# Patient Record
Sex: Male | Born: 1965 | Race: White | Hispanic: No | Marital: Married | Smoking: Never smoker
Health system: Southern US, Community
[De-identification: ages and names within clinical notes are randomized; demographics above are authoritative.]

## PROBLEM LIST (undated history)

## (undated) DIAGNOSIS — E0590 Thyrotoxicosis, unspecified without thyrotoxic crisis or storm: Secondary | ICD-10-CM

## (undated) DIAGNOSIS — E785 Hyperlipidemia, unspecified: Secondary | ICD-10-CM

## (undated) DIAGNOSIS — N400 Enlarged prostate without lower urinary tract symptoms: Secondary | ICD-10-CM

## (undated) DIAGNOSIS — G4733 Obstructive sleep apnea (adult) (pediatric): Secondary | ICD-10-CM

## (undated) DIAGNOSIS — G4730 Sleep apnea, unspecified: Secondary | ICD-10-CM

## (undated) DIAGNOSIS — T7840XA Allergy, unspecified, initial encounter: Secondary | ICD-10-CM

## (undated) DIAGNOSIS — I10 Essential (primary) hypertension: Secondary | ICD-10-CM

## (undated) DIAGNOSIS — J3089 Other allergic rhinitis: Secondary | ICD-10-CM

## (undated) DIAGNOSIS — Z86010 Personal history of colonic polyps: Secondary | ICD-10-CM

## (undated) HISTORY — DX: Sleep apnea, unspecified: G47.30

## (undated) HISTORY — PX: BLADDER SURGERY: SHX569

## (undated) HISTORY — DX: Other allergic rhinitis: J30.89

## (undated) HISTORY — DX: Obstructive sleep apnea (adult) (pediatric): G47.33

## (undated) HISTORY — DX: Allergy, unspecified, initial encounter: T78.40XA

## (undated) HISTORY — DX: Thyrotoxicosis, unspecified without thyrotoxic crisis or storm: E05.90

## (undated) HISTORY — DX: Essential (primary) hypertension: I10

## (undated) HISTORY — DX: Personal history of colonic polyps: Z86.010

## (undated) HISTORY — DX: Benign prostatic hyperplasia without lower urinary tract symptoms: N40.0

## (undated) HISTORY — PX: TONSILLECTOMY: SUR1361

## (undated) HISTORY — DX: Hyperlipidemia, unspecified: E78.5

---

## 1998-11-23 ENCOUNTER — Encounter: Payer: Self-pay | Admitting: Family Medicine

## 2000-03-28 ENCOUNTER — Encounter (INDEPENDENT_AMBULATORY_CARE_PROVIDER_SITE_OTHER): Payer: Self-pay | Admitting: Specialist

## 2000-03-28 ENCOUNTER — Ambulatory Visit (HOSPITAL_COMMUNITY): Admission: RE | Admit: 2000-03-28 | Discharge: 2000-03-28 | Payer: Self-pay | Admitting: Urology

## 2000-03-28 HISTORY — PX: CYSTOSCOPY: SUR368

## 2004-12-23 ENCOUNTER — Ambulatory Visit: Payer: Self-pay | Admitting: Family Medicine

## 2005-01-02 ENCOUNTER — Ambulatory Visit: Payer: Self-pay | Admitting: Family Medicine

## 2006-01-08 ENCOUNTER — Ambulatory Visit: Payer: Self-pay | Admitting: Family Medicine

## 2006-01-10 ENCOUNTER — Ambulatory Visit: Payer: Self-pay | Admitting: Family Medicine

## 2006-02-16 ENCOUNTER — Ambulatory Visit: Payer: Self-pay | Admitting: Family Medicine

## 2006-02-22 ENCOUNTER — Ambulatory Visit: Payer: Self-pay | Admitting: Family Medicine

## 2006-09-04 ENCOUNTER — Ambulatory Visit: Payer: Self-pay | Admitting: Family Medicine

## 2006-09-07 ENCOUNTER — Ambulatory Visit: Payer: Self-pay | Admitting: Family Medicine

## 2006-09-18 ENCOUNTER — Ambulatory Visit: Payer: Self-pay | Admitting: Oncology

## 2006-09-24 LAB — CBC WITH DIFFERENTIAL (CANCER CENTER ONLY)
BASO%: 2.6 % — ABNORMAL HIGH (ref 0.0–2.0)
EOS%: 3.3 % (ref 0.0–7.0)
HCT: 49.5 % (ref 38.7–49.9)
LYMPH%: 36.3 % (ref 14.0–48.0)
MCH: 31 pg (ref 28.0–33.4)
MCHC: 33.6 g/dL (ref 32.0–35.9)
MCV: 92 fL (ref 82–98)
NEUT%: 52 % (ref 40.0–80.0)
RDW: 12 % (ref 10.5–14.6)

## 2006-09-26 ENCOUNTER — Ambulatory Visit: Payer: Self-pay | Admitting: Internal Medicine

## 2006-09-26 LAB — IRON AND TIBC
TIBC: 366 ug/dL (ref 250–470)
UIBC: 266 ug/dL

## 2006-09-26 LAB — PROTEIN ELECTROPHORESIS, SERUM
Alpha-1-Globulin: 3.6 % (ref 2.9–4.9)
Alpha-2-Globulin: 8.4 % (ref 7.1–11.8)
Beta 2: 4.2 % (ref 3.2–6.5)
Gamma Globulin: 13.2 % (ref 11.1–18.8)

## 2006-09-26 LAB — COMPREHENSIVE METABOLIC PANEL
Albumin: 4.3 g/dL (ref 3.5–5.2)
CO2: 28 mEq/L (ref 19–32)
Glucose, Bld: 84 mg/dL (ref 70–99)
Total Protein: 6.3 g/dL (ref 6.0–8.3)

## 2006-09-26 LAB — FOLATE: Folate: 19.6 ng/mL

## 2006-09-26 LAB — RETICULOCYTES (CHCC)
ABS Retic: 31.9 10*3/uL (ref 19.0–186.0)
RBC.: 5.32 MIL/uL — ABNORMAL HIGH (ref 3.79–4.96)
Retic Ct Pct: 0.6 % (ref 0.4–3.1)

## 2006-09-26 LAB — LACTATE DEHYDROGENASE: LDH: 127 U/L (ref 94–250)

## 2006-09-28 LAB — HYPERCOAGULABLE PANEL, COMPREHENSIVE
Anticardiolipin IgA: <7 [APL'U] (ref <13)
Anticardiolipin IgG: <7 [GPL'U] (ref <11)
Anticardiolipin IgM: <7 [MPL'U] (ref <10)
Beta-2 Glyco I IgG: 22 U/mL (ref <20)
DRVVT: 42.8 secs (ref 31.9–44.2)
PTT Lupus Anticoagulant: 53.3 secs — ABNORMAL HIGH (ref 36.3–48.8)
PTTLA 4:1 Mix: 48.2 secs (ref 36.3–48.8)
Protein C, Total: 97 % (ref 63–153)

## 2006-09-28 LAB — BCR/ABL QUALITATIVE: BCR/ABL t(9,22): NEGATIVE

## 2006-10-08 ENCOUNTER — Encounter (INDEPENDENT_AMBULATORY_CARE_PROVIDER_SITE_OTHER): Payer: Self-pay | Admitting: Specialist

## 2006-10-08 ENCOUNTER — Ambulatory Visit: Payer: Self-pay | Admitting: Internal Medicine

## 2006-10-08 ENCOUNTER — Encounter: Payer: Self-pay | Admitting: Internal Medicine

## 2006-10-24 LAB — CBC WITH DIFFERENTIAL (CANCER CENTER ONLY)
BASO#: 0.1 10*3/uL (ref 0.0–0.2)
BASO%: 0.9 % (ref 0.0–2.0)
EOS%: 3.9 % (ref 0.0–7.0)
HGB: 17.2 g/dL — ABNORMAL HIGH (ref 13.0–17.1)
LYMPH#: 1.5 10*3/uL (ref 0.9–3.3)
MCHC: 34.2 g/dL (ref 32.0–35.9)
MONO%: 5.8 % (ref 0.0–13.0)
NEUT#: 4.3 10*3/uL (ref 1.5–6.5)
Platelets: 174 10*3/uL (ref 145–400)

## 2006-10-29 ENCOUNTER — Ambulatory Visit (HOSPITAL_COMMUNITY): Admission: RE | Admit: 2006-10-29 | Discharge: 2006-10-29 | Payer: Self-pay | Admitting: Oncology

## 2006-10-29 ENCOUNTER — Encounter (INDEPENDENT_AMBULATORY_CARE_PROVIDER_SITE_OTHER): Payer: Self-pay | Admitting: *Deleted

## 2006-11-20 ENCOUNTER — Ambulatory Visit: Payer: Self-pay | Admitting: Oncology

## 2006-11-21 LAB — CBC WITH DIFFERENTIAL (CANCER CENTER ONLY)
BASO%: 1.8 % (ref 0.0–2.0)
EOS%: 4.7 % (ref 0.0–7.0)
HCT: 49.6 % (ref 38.7–49.9)
LYMPH#: 1.3 10*3/uL (ref 0.9–3.3)
MCH: 32.2 pg (ref 28.0–33.4)
MCHC: 34.5 g/dL (ref 32.0–35.9)
MONO%: 7.1 % (ref 0.0–13.0)
NEUT#: 1.7 10*3/uL (ref 1.5–6.5)
NEUT%: 49.4 % (ref 40.0–80.0)
RDW: 12.1 % (ref 10.5–14.6)

## 2007-01-14 ENCOUNTER — Ambulatory Visit: Payer: Self-pay | Admitting: Oncology

## 2007-01-15 LAB — CBC WITH DIFFERENTIAL (CANCER CENTER ONLY)
BASO#: 0 10*3/uL (ref 0.0–0.2)
Eosinophils Absolute: 0.3 10*3/uL (ref 0.0–0.5)
HGB: 16.1 g/dL (ref 13.0–17.1)
LYMPH#: 1.4 10*3/uL (ref 0.9–3.3)
MCH: 31.9 pg (ref 28.0–33.4)
MONO#: 0.2 10*3/uL (ref 0.1–0.9)
NEUT#: 1.9 10*3/uL (ref 1.5–6.5)
Platelets: 179 10*3/uL (ref 145–400)
RBC: 5.04 10*6/uL (ref 4.20–5.70)
WBC: 3.8 10*3/uL — ABNORMAL LOW (ref 4.0–10.0)

## 2007-01-17 LAB — CARBOXYHEMOGLOBIN: Carboxyhemoglobin: 0.9 % (ref 0.0–1.5)

## 2007-04-12 ENCOUNTER — Ambulatory Visit: Payer: Self-pay | Admitting: Oncology

## 2007-04-23 LAB — CBC WITH DIFFERENTIAL (CANCER CENTER ONLY)
BASO#: 0.1 10*3/uL (ref 0.0–0.2)
Eosinophils Absolute: 0.2 10*3/uL (ref 0.0–0.5)
HCT: 49.4 % (ref 38.7–49.9)
HGB: 16.8 g/dL (ref 13.0–17.1)
LYMPH%: 39.3 % (ref 14.0–48.0)
MCH: 31.4 pg (ref 28.0–33.4)
MCV: 93 fL (ref 82–98)
MONO#: 0.3 10*3/uL (ref 0.1–0.9)
MONO%: 7.7 % (ref 0.0–13.0)
Platelets: 177 10*3/uL (ref 145–400)
RBC: 5.34 10*6/uL (ref 4.20–5.70)
WBC: 4.5 10*3/uL (ref 4.0–10.0)

## 2007-10-24 HISTORY — PX: COLONOSCOPY: SHX174

## 2007-11-14 IMAGING — CT CT BIOPSY
1 of 2 series · 15 of 32 positions shown, 19 images · non-contrast
Comparison: none

CLINICAL DATA: 40-year-old male with elevated red blood cell count concerning for polycythemia rubra Campante versus myelodysplastic syndrome. 
CT GUIDED LEFT ILIAC BONE MARROW ASPIRATE AND CORE BIOPSIES:
Radiologist:  Jansen Charley, M.D.
Guidance:  Non-contrast CT.
Complications:  No immediate complications.  
Medications:  2 mg Versed and 100 micrograms Fentanyl.
Procedure/Findings:  Informed consent was obtained from the patient.  
The patient was positioned prone.  Localization noncontrast CT was performed to identify the left iliac crest.  From a posterior approach, an 11 gauge bone biopsy needle was advanced under sterile conditions and local anesthesia.  Needle position was confirmed within the left iliac bone marrow.  Initially, two bone marrow aspirates were performed followed by an 11 gauge core biopsy.  Samples were adequate and reviewed with the cytotechnologist.  The biopsy needle was removed.  There were no immediate complications.  Hemostasis was obtained with compression.  The patient tolerated the procedure well.

[Series 2: pel 5.0 b70f st · axial · 0.80mm/px · z∈[-236,-80]mm · 15 of 36 slices shown, 19 images]
[im 3/36  soft-tissue]
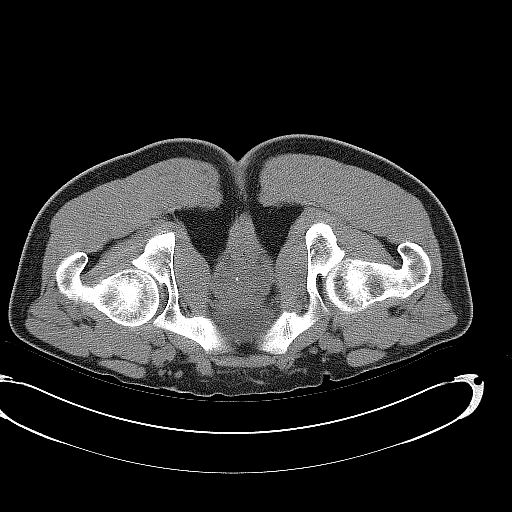
[im 3/36  bone]
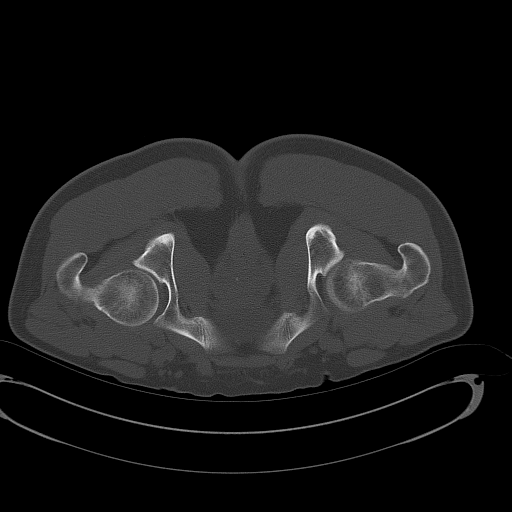
[im 5/36  soft-tissue]
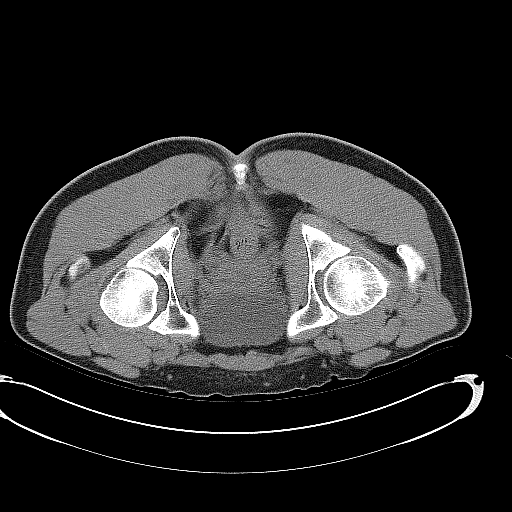
[im 8/36  soft-tissue]
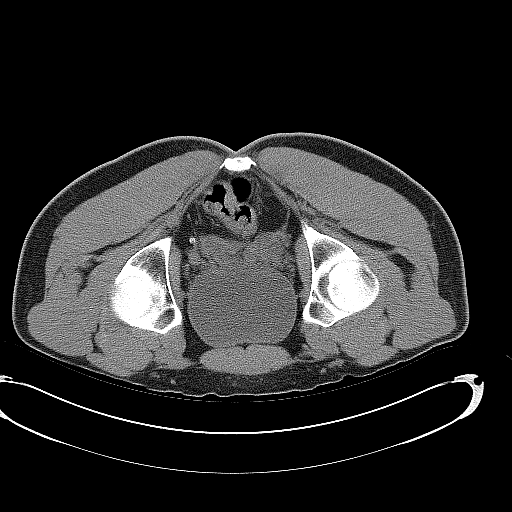
[im 10/36  soft-tissue]
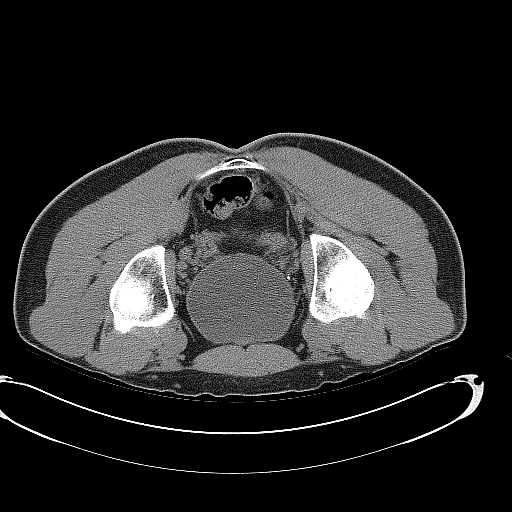
[im 13/36  soft-tissue]
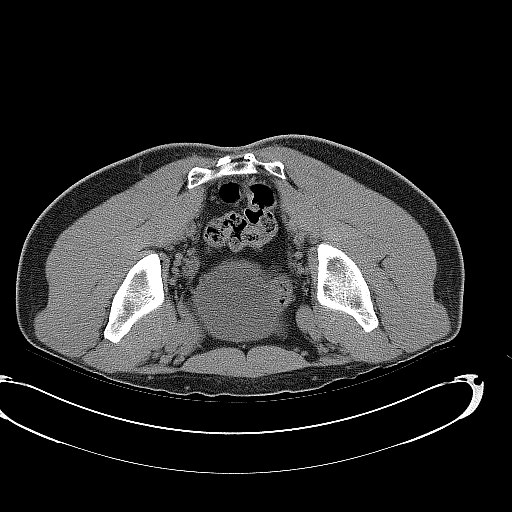
[im 15/36  soft-tissue]
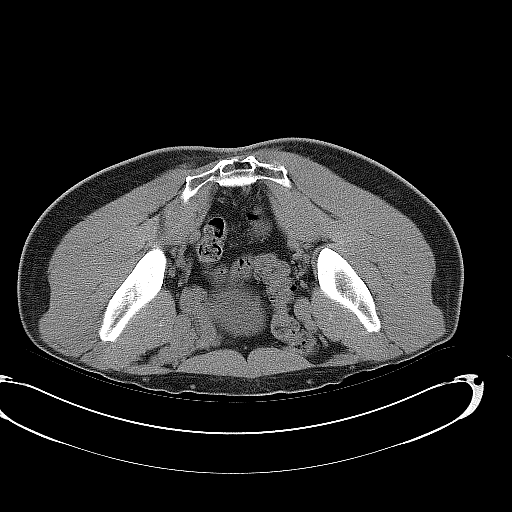
[im 19/36  soft-tissue]
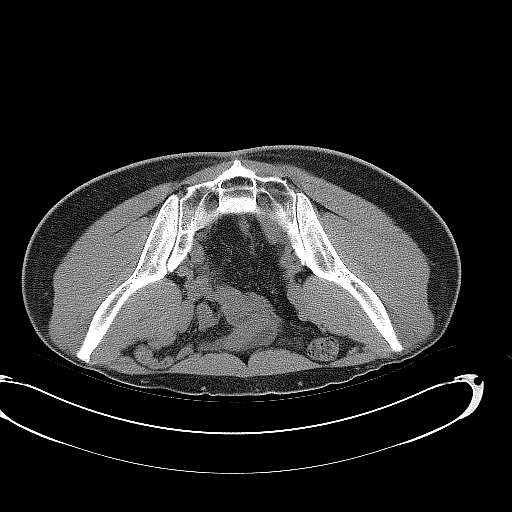
[im 21/36  soft-tissue]
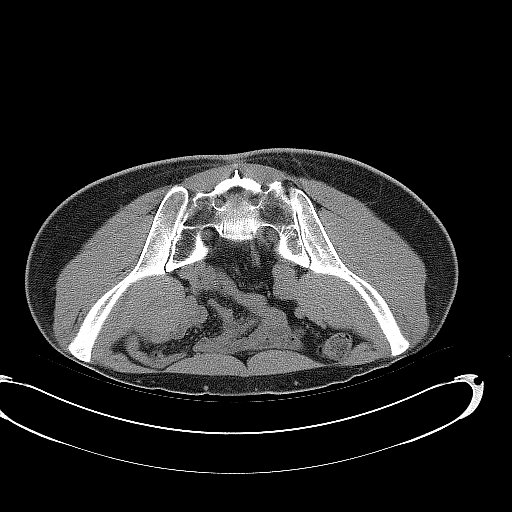
[im 23/36  soft-tissue]
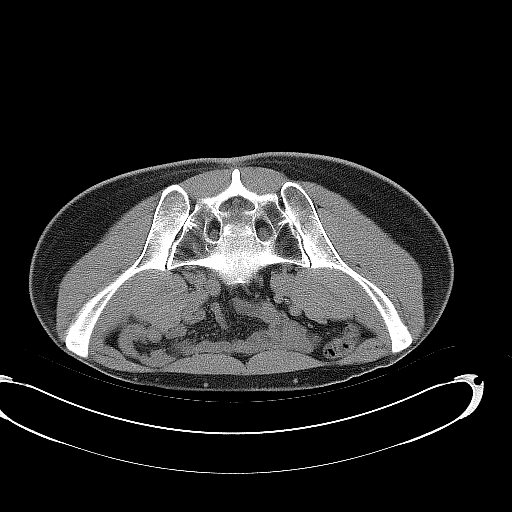
[im 23/36  bone]
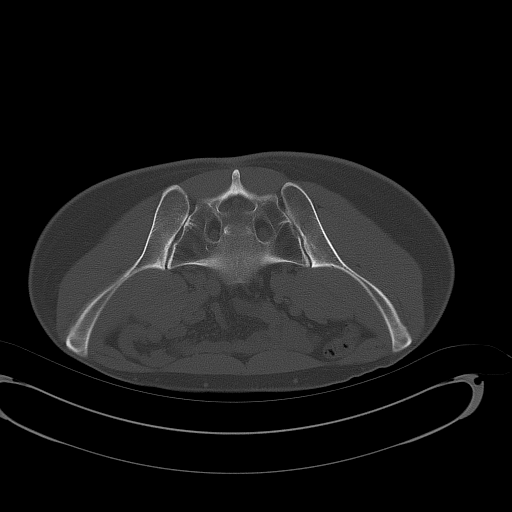
[im 26/36  soft-tissue]
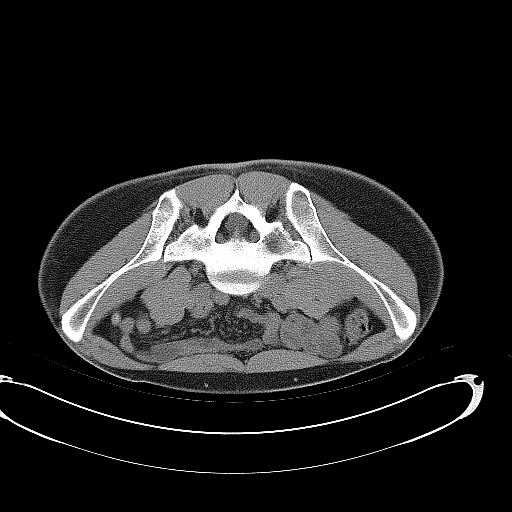
[im 28/36  soft-tissue]
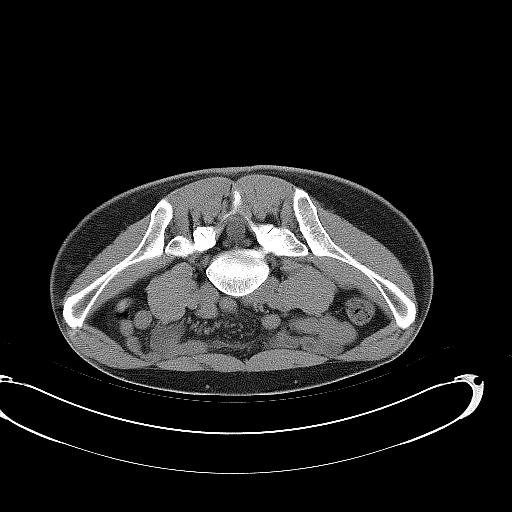
[im 31/36  soft-tissue]
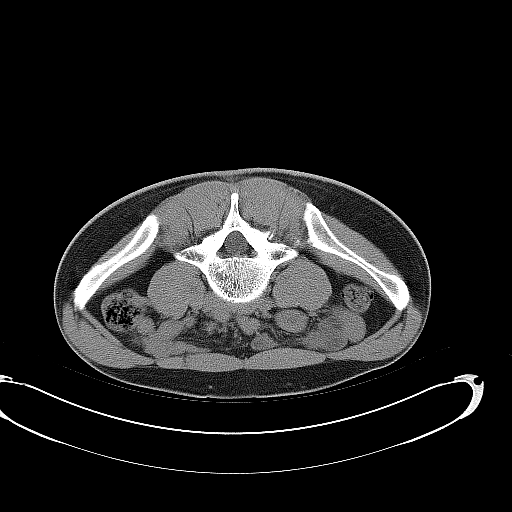
[im 31/36  lung]
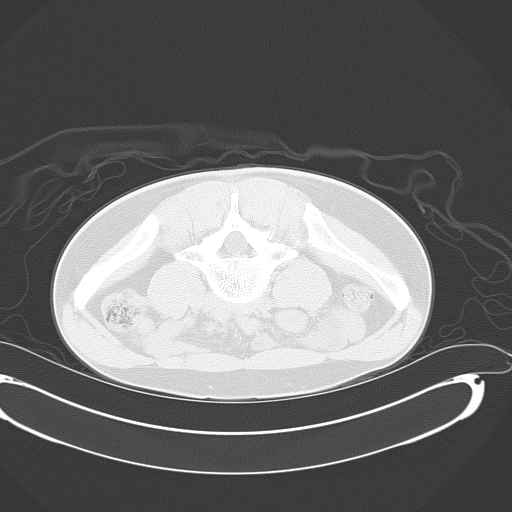
[im 32/36  lung]
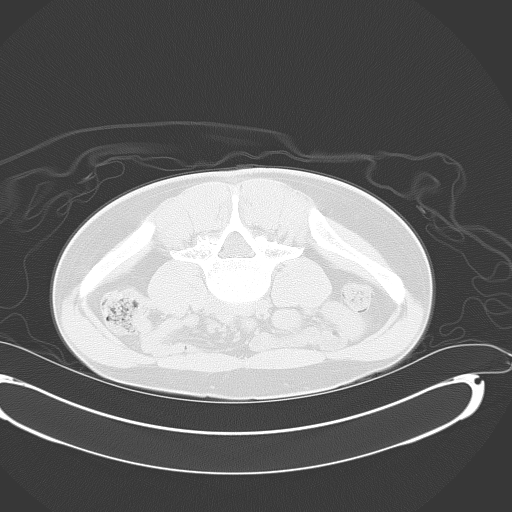
[im 33/36  soft-tissue]
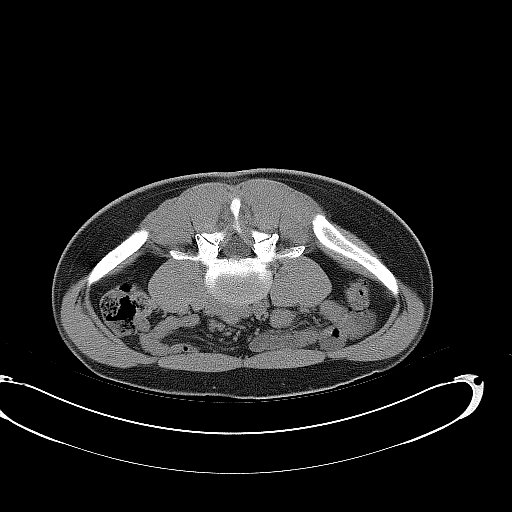
[im 33/36  lung]
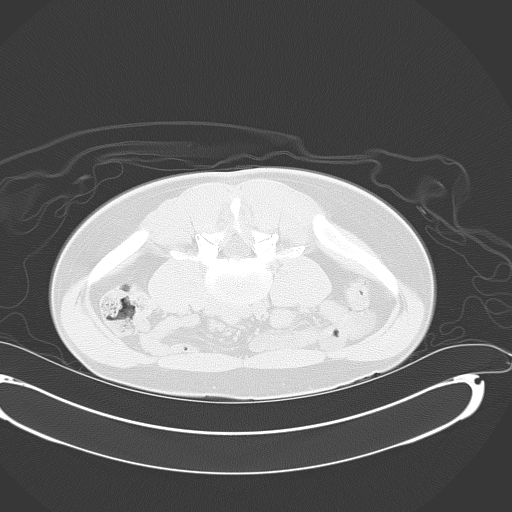
[im 34/36  lung]
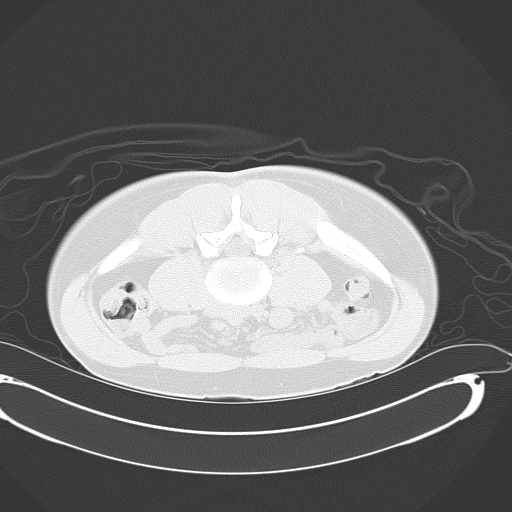

[15 of 32 positions shown; findings below may reference images not displayed]

IMPRESSION: 1.  CT guided left iliac bone marrow aspirate and core biopsies utilizing an 11 gauge bone biopsy set.
PLAN:  The patient will be in recovery for 1 hour.

## 2008-01-09 ENCOUNTER — Ambulatory Visit: Payer: Self-pay | Admitting: Family Medicine

## 2008-01-09 DIAGNOSIS — E785 Hyperlipidemia, unspecified: Secondary | ICD-10-CM | POA: Insufficient documentation

## 2008-01-09 DIAGNOSIS — N401 Enlarged prostate with lower urinary tract symptoms: Secondary | ICD-10-CM

## 2008-01-09 DIAGNOSIS — N138 Other obstructive and reflux uropathy: Secondary | ICD-10-CM | POA: Insufficient documentation

## 2008-01-10 ENCOUNTER — Ambulatory Visit: Payer: Self-pay | Admitting: Family Medicine

## 2008-01-10 LAB — CONVERTED CEMR LAB
AST: 19 units/L (ref 0–37)
Alkaline Phosphatase: 49 units/L (ref 39–117)
BUN: 11 mg/dL (ref 6–23)
Bilirubin, Direct: 0.1 mg/dL (ref 0.0–0.3)
CO2: 30 meq/L (ref 19–32)
Calcium: 9.4 mg/dL (ref 8.4–10.5)
Chloride: 104 meq/L (ref 96–112)
GFR calc non Af Amer: 79 mL/min
HDL: 46.3 mg/dL (ref >39.0)
TSH: 0.43 microintl units/mL (ref 0.35–5.50)
Total CHOL/HDL Ratio: 3.3

## 2008-09-08 ENCOUNTER — Telehealth: Payer: Self-pay | Admitting: Family Medicine

## 2009-03-26 ENCOUNTER — Ambulatory Visit: Payer: Self-pay | Admitting: Internal Medicine

## 2009-04-01 ENCOUNTER — Telehealth (INDEPENDENT_AMBULATORY_CARE_PROVIDER_SITE_OTHER): Payer: Self-pay | Admitting: Internal Medicine

## 2010-01-31 ENCOUNTER — Ambulatory Visit: Payer: Self-pay | Admitting: Family Medicine

## 2010-01-31 DIAGNOSIS — N410 Acute prostatitis: Secondary | ICD-10-CM | POA: Insufficient documentation

## 2010-02-02 ENCOUNTER — Encounter: Payer: Self-pay | Admitting: Family Medicine

## 2010-02-02 DIAGNOSIS — J309 Allergic rhinitis, unspecified: Secondary | ICD-10-CM | POA: Insufficient documentation

## 2010-02-02 DIAGNOSIS — D751 Secondary polycythemia: Secondary | ICD-10-CM | POA: Insufficient documentation

## 2010-02-02 DIAGNOSIS — Z87898 Personal history of other specified conditions: Secondary | ICD-10-CM | POA: Insufficient documentation

## 2010-03-03 ENCOUNTER — Ambulatory Visit: Payer: Self-pay | Admitting: Family Medicine

## 2010-03-03 LAB — CONVERTED CEMR LAB
ALT: 14 units/L (ref 0–53)
AST: 19 units/L (ref 0–37)
Basophils Absolute: 0 10*3/uL (ref 0.0–0.1)
Bilirubin, Direct: 0.1 mg/dL (ref 0.0–0.3)
Calcium: 9 mg/dL (ref 8.4–10.5)
Cholesterol: 168 mg/dL (ref 0–200)
Creatinine, Ser: 1 mg/dL (ref 0.4–1.5)
Eosinophils Absolute: 0.1 10*3/uL (ref 0.0–0.7)
Glucose, Bld: 95 mg/dL (ref 70–99)
HCT: 44.8 % (ref 39.0–52.0)
Hemoglobin: 15.9 g/dL (ref 13.0–17.0)
MCHC: 35.4 g/dL (ref 30.0–36.0)
MCV: 92.8 fL (ref 78.0–100.0)
Monocytes Relative: 10 % (ref 3.0–12.0)
Neutrophils Relative %: 48.2 % (ref 43.0–77.0)
Platelets: 174 10*3/uL (ref 150.0–400.0)
RDW: 12.9 % (ref 11.5–14.6)
Sodium: 142 meq/L (ref 135–145)
TSH: 0.49 microintl units/mL (ref 0.35–5.50)
Total Bilirubin: 0.7 mg/dL (ref 0.3–1.2)
WBC: 3.2 10*3/uL — ABNORMAL LOW (ref 4.5–10.5)

## 2010-03-09 ENCOUNTER — Ambulatory Visit: Payer: Self-pay | Admitting: Family Medicine

## 2010-03-09 DIAGNOSIS — L408 Other psoriasis: Secondary | ICD-10-CM | POA: Insufficient documentation

## 2010-03-25 ENCOUNTER — Ambulatory Visit: Payer: Self-pay | Admitting: Family Medicine

## 2010-03-25 DIAGNOSIS — J018 Other acute sinusitis: Secondary | ICD-10-CM | POA: Insufficient documentation

## 2010-04-07 ENCOUNTER — Ambulatory Visit: Payer: Self-pay | Admitting: Family Medicine

## 2010-04-07 DIAGNOSIS — J029 Acute pharyngitis, unspecified: Secondary | ICD-10-CM | POA: Insufficient documentation

## 2010-05-26 ENCOUNTER — Encounter (INDEPENDENT_AMBULATORY_CARE_PROVIDER_SITE_OTHER): Payer: Self-pay | Admitting: *Deleted

## 2010-06-02 ENCOUNTER — Encounter: Payer: Self-pay | Admitting: Family Medicine

## 2010-06-16 ENCOUNTER — Telehealth: Payer: Self-pay | Admitting: Family Medicine

## 2010-11-22 NOTE — Progress Notes (Signed)
Summary: avodart and flomax  Phone Note Refill Request Call back at Home Phone [phone] Message from:  Patient on June 16, 2010 8:32 AM  Refills Requested: Medication #1:  AVODART 0.5 MG  CAPS 1 daily by mouth at bedtime  Medication #2:  FLOMAX 0.4 MG CAPS take one tablet by mouth as needed Patient would like these faxed to [facility].   Initial call taken by: Melody Comas,  June 16, 2010 8:32 AM  Follow-up for Phone Call        done.  Follow-up by: Crawford Givens MD,  June 16, 2010 9:44 AM    Prescriptions: AVODART 0.5 MG  CAPS (DUTASTERIDE) 1 daily by mouth at bedtime  #90 x 3   Entered and Authorized by:   Crawford Givens MD   Signed by:   Crawford Givens MD on 06/16/2010   Method used:   Faxed to ...       [facility] (mail-order)             , [place]         Ph: [phone]       Fax: [phone]   RxID:   [id number] FLOMAX 0.4 MG CAPS (TAMSULOSIN HCL) take one tablet by mouth as needed  #90 x 3   Entered and Authorized by:   Crawford Givens MD   Signed by:   Crawford Givens MD on 06/16/2010   Method used:   Faxed to ...       [facility] (mail-order)             , [place]         Ph: [phone]       Fax: [phone]   RxID:   [id number]

## 2010-11-22 NOTE — Consult Note (Signed)
Summary: [facility]  [facility]   Imported By: Maryln Gottron 06/07/2010 15:03:53  _____________________________________________________________________  External Attachment:    Type:   Image     Comment:   External Document  Appended Document:  [facility]     Clinical Lists Changes  Observations: Added new observation of PAST MED HX: Allergic rhinitis- eval by Dr. Jenne Pane with ENT 2011  (06/08/2010 13:18)       Past History:  Past Medical History: Allergic rhinitis- eval by Dr. Jenne Pane with ENT 2011

## 2010-11-22 NOTE — Letter (Signed)
Summary: [name] letter  William Tucker at [facility]  [address] [place], [place] [address]   Phone: [phone]  Fax: [phone]       05/26/2010 MRN: [id number]  [name] [address] [place], [place]  [address]  Dear Mr. [facility] [place] and [place] announce the retirement of William Tucker, M.D., from full-time practice at the [facility] office effective April 21, 2010 and his plans of returning part-time.  It is important to [name] and to our practice that you understand that [facility] has seven physicians in our office for your health care needs.  We will continue to offer the same exceptional care that you have today.    [name] has spoken to many of you about his plans for retirement and returning part-time in the fall.   We will continue to work with you through the transition to schedule appointments for you in the office and meet the high standards that [facility] is committed to.   Again, it is with great pleasure that we share the news that [name] will return to [facility] at [facility] in October of 2011 with a reduced schedule.    If you have any questions, or would like to request an appointment with one of our physicians, please call us at [phone] and press the option for Scheduling an appointment.  We take pleasure in providing you with excellent patient care and look forward to seeing you at your next office visit.  Our [facility] Physicians are:  Tillman Abide, M.D. Laurita Quint, M.D. Roxy Manns, M.D. Kerby Nora, M.D. Hannah Beat, M.D. Ruthe Mannan, M.D. We proudly welcomed Raechel Ache, M.D. and Eustaquio Boyden, M.D. to the practice in July/August 2011.  Sincerely,  [facility]

## 2010-11-22 NOTE — Assessment & Plan Note (Signed)
Summary: SINUS INFECTION/JRR   Vital Signs:  Patient profile:   45 year old male Height:      67.25 inches Weight:      187.25 pounds BMI:     29.22 Temp:     98.3 degrees F oral Pulse rate:   68 / minute Pulse rhythm:   regular BP sitting:   110 / 82  (right arm) Cuff size:   regular  Vitals Entered By: Linde Gillis CMA Duncan Dull) (March 25, 2010 3:09 PM) CC: ? sinus infection Comments Patient is a Emergency planning/management officer for [employer] and he weighed with his belt on including gun and flashlight, approx 20 pounds.   History of Present Illness: 45 yo here for ? sinus infecition.  Started with runny nose, sore throat last week. Now has progressed with chills, body aches, productive cough, sinus pressure. NO wheezing, SOB, or CP. No n/v/d.  Current Medications (verified): 1)  Flomax 0.4 Mg Caps (Tamsulosin Hcl) .... Take One Tablet By Mouth As Needed 2)  Avodart 0.5 Mg  Caps (Dutasteride) .Marland Kitchen.. 1 Daily By Mouth At Bedtime 3)  Multivitamins   Tabs (Multiple Vitamin) .Marland Kitchen.. 1 Daily As Needed By Mouth 4)  Afrin Nasal Spray 0.05 % Soln (Oxymetazoline Hcl) .... Otc As Directed 5)  Clarinex 5 Mg Tabs (Desloratadine) .Marland Kitchen.. 1 Once Daily For Nasal Congestion 6)  Nasonex 50 Mcg/act Susp (Mometasone Furoate) .... 2 Sprays Each Nostril Once Daily 7)  Aspirin 81 Mg Tbec (Aspirin) .... Take One By Mouth Daily 8)  Augmentin 875-125 Mg Tabs (Amoxicillin-Pot Clavulanate) .Marland Kitchen.. 1 By Mouth 2 Times Daily X 10 Days  Allergies (verified): No Known Drug Allergies  Review of Systems      See HPI General:  Complains of malaise; denies fever. ENT:  Complains of nasal congestion, postnasal drainage, sinus pressure, and sore throat. Resp:  Complains of cough and sputum productive; denies shortness of breath and wheezing.  Physical Exam  General:  Well-developed,well-nourished,in no acute distress; alert,appropriate and cooperative throughout examination VSS Ears:  TMs retracted bilaterally Nose:  boggy  turbinates Mouth:  pharyngeal erythema.   Lungs:  Normal respiratory effort, chest expands symmetrically. Lungs are clear to auscultation, no crackles or wheezes. Heart:  Normal rate and regular rhythm. S1 and S2 normal without gallop, murmur, click, rub or other extra sounds. Extremities:  No clubbing, cyanosis, edema, or deformity noted with normal full range of motion of all joints.   Psych:  Cognition and judgment appear intact. Alert and cooperative with normal attention span and concentration. No apparent delusions, illusions, hallucinations   Impression & Recommendations:  Problem # 1:  OTHER ACUTE SINUSITIS (ICD-461.8) Assessment New Given duration and progression of symptoms, will treat for bacterial sinusitis with Augmentin.  See pt instructions for supportive care. His updated medication list for this problem includes:    Afrin Nasal Spray 0.05 % Soln (Oxymetazoline hcl) ..... Otc as directed    Nasonex 50 Mcg/act Susp (Mometasone furoate) .Marland Kitchen... 2 sprays each nostril once daily    Augmentin 875-125 Mg Tabs (Amoxicillin-pot clavulanate) .Marland Kitchen... 1 by mouth 2 times daily x 10 days  Complete Medication List: 1)  Flomax 0.4 Mg Caps (Tamsulosin hcl) .... Take one tablet by mouth as needed 2)  Avodart 0.5 Mg Caps (Dutasteride) .Marland Kitchen.. 1 daily by mouth at bedtime 3)  Multivitamins Tabs (Multiple vitamin) .Marland Kitchen.. 1 daily as needed by mouth 4)  Afrin Nasal Spray 0.05 % Soln (Oxymetazoline hcl) .... Otc as directed 5)  Clarinex 5  Mg Tabs (Desloratadine) .Marland Kitchen.. 1 once daily for nasal congestion 6)  Nasonex 50 Mcg/act Susp (Mometasone furoate) .... 2 sprays each nostril once daily 7)  Aspirin 81 Mg Tbec (Aspirin) .... Take one by mouth daily 8)  Augmentin 875-125 Mg Tabs (Amoxicillin-pot clavulanate) .Marland Kitchen.. 1 by mouth 2 times daily x 10 days  Patient Instructions: 1)  Take Augmentin as directed.  Drink lots of fluids.  Treat sympotmatically with Mucinex, nasal saline irrigation, and Tylenol/Ibuprofen.  Also try claritin D or zyrtec D over the counter- two times a day as needed ( have to sign for them at pharmacy). You can use warm compresses.  Cough suppressant at night. Call if not improving as expected in 5-7 days.  Prescriptions: AUGMENTIN 875-125 MG TABS (AMOXICILLIN-POT CLAVULANATE) 1 by mouth 2 times daily x 10 days  #20 x 0   Entered and Authorized by:   Ruthe Mannan MD   Signed by:   Ruthe Mannan MD on 03/25/2010   Method used:   Electronically to        [facility]* (retail)       [address]       [place], [place]  [address]       Ph: [phone]       Fax: [phone]   RxID:   [id number]   Current Allergies (reviewed today): No known allergies

## 2010-11-22 NOTE — Assessment & Plan Note (Signed)
Summary: ?PROSTATE/CLE   Vital Signs:  Patient profile:   45 year old male Weight:      170 pounds BMI:     26.52 Temp:     98.4 degrees F oral Pulse rate:   72 / minute Pulse rhythm:   regular BP sitting:   118 / 84  (left arm) Cuff size:   regular  Vitals Entered By: Sydell Axon LPN (January 31, 2010 10:31 AM) CC: Pain in left side that goes down into the left testicle   History of Present Illness: Pt here for pain in the left inguinal area into the left testicle. He thinks Avodart or Flomax cause this and he backed off to every other day. Yesterday he had left ingunal pain and after sex had some mild blood in his ejaculate in the condom. He has no burning with urination. He thinks wearing his holster belt worsens this...belt balanced in weight. He hasn't beren here for some time but has appt next month for PE.  Problems Prior to Update: 1)  Uri  (ICD-465.9) 2)  Special Screening Malignant Neoplasm of Prostate  (ICD-V76.44) 3)  Hyperlipidemia  (ICD-272.4) 4)  Allergy  (ICD-995.3) 5)  Hypertrophy Prostate W/ur Obst & Oth Luts  (ICD-600.01) 6)  Health Maintenance Exam  (ICD-V70.0)  Medications Prior to Update: 1)  Flomax 0.4 Mg  Cp24 (Tamsulosin Hcl) .Marland Kitchen.. 1 At Bedtime 2)  Avodart 0.5 Mg  Caps (Dutasteride) .Marland Kitchen.. 1 Daily By Mouth 3)  Allegra 180 Mg  Tabs (Fexofenadine Hcl) .Marland Kitchen.. 1 Daily By Mouth 4)  Multivitamins   Tabs (Multiple Vitamin) .Marland Kitchen.. 1 Daily As Needed By Mouth 5)  Aspirtab 324 Mg  Tbec (Aspirin) .Marland Kitchen.. 1 Daily By Mouth 6)  Mucinex D 60-600 Mg Xr12h-Tab (Pseudoephedrine-Guaifenesin) .... Otc Use As Directed 7)  Afrin Nasal Spray 0.05 % Soln (Oxymetazoline Hcl) .... Otc As Directed 8)  Clarinex 5 Mg Tabs (Desloratadine) .Marland Kitchen.. 1 Once Daily For Nasal Congestion 9)  Nasonex 50 Mcg/act Susp (Mometasone Furoate) .... 2 Sprays Each Nostril Once Daily 10)  Zithromax Z-Pak 250 Mg Tabs (Azithromycin) .... Use As Directed  Allergies: No Known Drug Allergies  Physical  Exam  General:  alert, well-developed, well-nourished, and well-hydrated.   Head:  Normocephalic and atraumatic without obvious abnormalities. No apparent alopecia or balding. Eyes:  pupils equal, pupils round, and no injection.   Ears:  TMs retracted with increased fluid  Nose:  no airflow obstruction, mucosal erythema, and mucosal edema.  sinuses neg Mouth:  no exudates and pharyngeal erythema.   Neck:  No deformities, masses, or tenderness noted. Lungs:  moist cough, no crackles and no wheezes.   Heart:  Normal rate and regular rhythm. S1 and S2 normal without gallop, murmur, click, rub or other extra sounds. Abdomen:  Bowel sounds positive,abdomen soft and non-tender without masses, organomegaly or hernias noted. Rectal:  No external abnormalities noted. Normal sphincter tone. No rectal masses or tenderness. G neg. Genitalia:  Testes bilaterally descended without nodularity, tenderness or masses. No scrotal masses or lesions. No penis lesions or urethral discharge. Prostate:  Prostate gland firm and smooth, no enlargement, nodularity,  mass,or asymmetry. Does have tenderness and  induration, L>R.. 10-20gms.   Impression & Recommendations:  Problem # 1:  ACUTE PROSTATITIS (ICD-601.0) Assessment New Sent Cipro script.  Call if sxs don't resolve.  Complete Medication List: 1)  Flomax 0.4 Mg Cp24 (Tamsulosin hcl) .Marland Kitchen.. 1 at bedtime 2)  Avodart 0.5 Mg Caps (Dutasteride) .Marland Kitchen.. 1 daily by mouth 3)  Multivitamins Tabs (Multiple vitamin) .Marland Kitchen.. 1 daily as needed by mouth 4)  Afrin Nasal Spray 0.05 % Soln (Oxymetazoline hcl) .... Otc as directed 5)  Clarinex 5 Mg Tabs (Desloratadine) .Marland Kitchen.. 1 once daily for nasal congestion 6)  Nasonex 50 Mcg/act Susp (Mometasone furoate) .... 2 sprays each nostril once daily 7)  Aspirin 81 Mg Tbec (Aspirin) .... Take one by mouth daily 8)  Ciprofloxacin Hcl 500 Mg Tabs (Ciprofloxacin hcl) .... One tab by mouth two times a day  Patient Instructions: 1)  RTC as  scheduled. Prescriptions: CIPROFLOXACIN HCL 500 MG TABS (CIPROFLOXACIN HCL) one tab by mouth two times a day  #20 x 0   Entered and Authorized by:   Shaune Leeks MD   Signed by:   Shaune Leeks MD on 01/31/2010   Method used:   Electronically to        [facility]* (retail)       [address]       [place], [place]  [address]       Ph: [phone]       Fax: [phone]   RxID:   [id number] CIPROFLOXACIN HCL 500 MG TABS (CIPROFLOXACIN HCL) one tab by mouth two times a day  #20 x 0   Entered and Authorized by:   Shaune Leeks MD   Signed by:   Shaune Leeks MD on 01/31/2010   Method used:   Electronically to        [facility]* (mail-order)             ,          Ph: [phone]       Fax: [phone]   RxID:   [id number]   Current Allergies (reviewed today): No known allergies

## 2010-11-22 NOTE — Assessment & Plan Note (Signed)
Summary: SORE THROAT   Vital Signs:  Patient profile:   45 year old male Height:      67.25 inches Weight:      165 pounds BMI:     25.74 Temp:     98.6 degrees F oral Pulse rate:   72 / minute Pulse rhythm:   regular BP sitting:   110 / 70  (right arm) Cuff size:   regular  Vitals Entered By: Linde Gillis CMA Duncan Dull) (April 07, 2010 2:52 PM) CC: sore throat   History of Present Illness: 45 yo here for sore throat.  I saw him on 03/25/2010, given 10 day course of Augmentin for sinusiitis. Several days later, symptoms improved dramatically. Nasal congestion, sinus pressure, sore throat and ear pain had all resolved.  2 days ago, throat started to hurt again. All other symptoms are still improved. Hurts mainly at night. No coughing.  No difficulty swallowing.  Current Medications (verified): 1)  Flomax 0.4 Mg Caps (Tamsulosin Hcl) .... Take One Tablet By Mouth As Needed 2)  Avodart 0.5 Mg  Caps (Dutasteride) .Marland Kitchen.. 1 Daily By Mouth At Bedtime 3)  Multivitamins   Tabs (Multiple Vitamin) .Marland Kitchen.. 1 Daily As Needed By Mouth 4)  Clarinex 5 Mg Tabs (Desloratadine) .Marland Kitchen.. 1 Once Daily For Nasal Congestion 5)  Nasonex 50 Mcg/act Susp (Mometasone Furoate) .... 2 Sprays Each Nostril Once Daily 6)  Aspirin 81 Mg Tbec (Aspirin) .... Take One By Mouth Daily 7)  Alka-Seltzer Plus Cold 2-7.8-325 Mg Tbef (Chlorphen-Phenyleph-Asa) .... Take As Directed 8)  Azithromycin 250 Mg  Tabs (Azithromycin) .... 2 By  Mouth Today and Then 1 Daily For 4 Days  Allergies (verified): No Known Drug Allergies  Review of Systems      See HPI General:  Denies chills and fever. ENT:  Complains of sore throat. Resp:  Denies cough.  Physical Exam  General:  Well-developed,well-nourished,in no acute distress; alert,appropriate and cooperative throughout examination VSS Ears:  R ear normal and L ear normal.   Nose:  mild erythema, much improved Mouth:  mild pharyngeal erythema, no exudates Lungs:  Normal  respiratory effort, chest expands symmetrically. Lungs are clear to auscultation, no crackles or wheezes. Heart:  Normal rate and regular rhythm. S1 and S2 normal without gallop, murmur, click, rub or other extra sounds. Extremities:  No clubbing, cyanosis, edema, or deformity noted with normal full range of motion of all joints.   Psych:  Cognition and judgment appear intact. Alert and cooperative with normal attention span and concentration. No apparent delusions, illusions, hallucinations   Impression & Recommendations:  Problem # 1:  ACUTE PHARYNGITIS (ICD-462) Assessment New Rapid strep neg. Likely residual soreness from post nasal drip. Given written prescription for Zpack to fill only if symptoms dramatically worsen over the weekend. Called in a prescription for magic mouthwash to [facility]. His updated medication list for this problem includes:    Aspirin 81 Mg Tbec (Aspirin) .Marland Kitchen... Take one by mouth daily    Azithromycin 250 Mg Tabs (Azithromycin) .Marland Kitchen... 2 by  mouth today and then 1 daily for 4 days  Orders: Rapid Strep (04540)  Complete Medication List: 1)  Flomax 0.4 Mg Caps (Tamsulosin hcl) .... Take one tablet by mouth as needed 2)  Avodart 0.5 Mg Caps (Dutasteride) .Marland Kitchen.. 1 daily by mouth at bedtime 3)  Multivitamins Tabs (Multiple vitamin) .Marland Kitchen.. 1 daily as needed by mouth 4)  Clarinex 5 Mg Tabs (Desloratadine) .Marland Kitchen.. 1 once daily for nasal congestion 5)  Nasonex 50  Mcg/act Susp (Mometasone furoate) .... 2 sprays each nostril once daily 6)  Aspirin 81 Mg Tbec (Aspirin) .... Take one by mouth daily 7)  Alka-seltzer Plus Cold 2-7.8-325 Mg Tbef (Chlorphen-phenyleph-asa) .... Take as directed 8)  Azithromycin 250 Mg Tabs (Azithromycin) .... 2 by  mouth today and then 1 daily for 4 days Prescriptions: AZITHROMYCIN 250 MG  TABS (AZITHROMYCIN) 2 by  mouth today and then 1 daily for 4 days  #6 x 0   Entered and Authorized by:   Ruthe Mannan MD   Signed by:   Ruthe Mannan MD on 04/07/2010    Method used:   Print then Give to Patient   RxID:   [id number]   Current Allergies (reviewed today): No known allergies

## 2010-11-22 NOTE — Assessment & Plan Note (Signed)
Summary: CPX  CYD   Vital Signs:  Patient profile:   45 year old male Weight:      168 pounds Temp:     97.8 degrees F oral Pulse rate:   76 / minute Pulse rhythm:   regular BP sitting:   108 / 70  (left arm) Cuff size:   regular  Vitals Entered By: Sydell Axon LPN (Mar 09, 2010 2:54 PM) CC: 30 Minute checkup   History of Present Illness: Pt here for Comp Exam, feels well with no complaints.  Preventive Screening-Counseling & Management  Alcohol-Tobacco     Alcohol drinks/day: 1     Alcohol type: beer     Smoking Status: never     Passive Smoke Exposure: no  Caffeine-Diet-Exercise     Caffeine use/day: 4     Does Patient Exercise: yes     Type of exercise: cardio, wt lifting     Times/week: 1  Problems Prior to Update: 1)  Hx of Polycythemia  (ICD-289.0) 2)  Benign Prostatic Hypertrophy, Hx of  (ICD-V13.8) 3)  Allergic Rhinitis  (ICD-477.9) 4)  Acute Prostatitis  (ICD-601.0) 5)  Special Screening Malignant Neoplasm of Prostate  (ICD-V76.44) 6)  Hyperlipidemia  (ICD-272.4) 7)  Allergy  (ICD-995.3) 8)  Hypertrophy Prostate W/ur Obst & Oth Luts  (ICD-600.01) 9)  Health Maintenance Exam  (ICD-V70.0)  Medications Prior to Update: 1)  Flomax 0.4 Mg  Cp24 (Tamsulosin Hcl) .Marland Kitchen.. 1 At Bedtime 2)  Avodart 0.5 Mg  Caps (Dutasteride) .Marland Kitchen.. 1 Daily By Mouth 3)  Multivitamins   Tabs (Multiple Vitamin) .Marland Kitchen.. 1 Daily As Needed By Mouth 4)  Afrin Nasal Spray 0.05 % Soln (Oxymetazoline Hcl) .... Otc As Directed 5)  Clarinex 5 Mg Tabs (Desloratadine) .Marland Kitchen.. 1 Once Daily For Nasal Congestion 6)  Nasonex 50 Mcg/act Susp (Mometasone Furoate) .... 2 Sprays Each Nostril Once Daily 7)  Aspirin 81 Mg Tbec (Aspirin) .... Take One By Mouth Daily 8)  Ciprofloxacin Hcl 500 Mg Tabs (Ciprofloxacin Hcl) .... One Tab By Mouth Two Times A Day  Allergies: No Known Drug Allergies  Past History:  Past Medical History: Last updated: 02/02/2010 Allergic rhinitis  Past Surgical History: Last  updated: 02/02/2010 Tonsillectomy as a child Cystoscopy- biopsy negative for ICS (03/28/2000) Colonoscopy- 2 benign polyps (10/03/2001)  Family History: Last updated: 03/09/2010 Father A 64 Htn Chol Mother A 63 Chol  No Sibs  Social History: Last updated: 01/09/2008 Occupation:Law enforcement  Married lives w wife   2 Sons  Risk Factors: Alcohol Use: 1 (03/09/2010) Caffeine Use: 4 (03/09/2010) Exercise: yes (03/09/2010)  Risk Factors: Smoking Status: never (03/09/2010) Passive Smoke Exposure: no (03/09/2010)  Family History: Father A 64 Htn Chol Mother A 63 Chol  No Sibs  Review of Systems General:  Denies chills, fatigue, fever, sweats, weakness, and weight loss. Eyes:  Denies blurring, discharge, and eye pain. ENT:  Denies decreased hearing, earache, and ringing in ears. CV:  Denies chest pain or discomfort, fainting, fatigue, shortness of breath with exertion, swelling of feet, and swelling of hands. Resp:  Denies chest discomfort, cough, and shortness of breath. GI:  Denies abdominal pain, bloody stools, change in bowel habits, constipation, dark tarry stools, diarrhea, indigestion, loss of appetite, nausea, vomiting, vomiting blood, and yellowish skin color. GU:  Complains of nocturia; denies dysuria and urinary frequency; Rare. MS:  Denies joint swelling, low back pain, cramps, muscle weakness, and stiffness. Derm:  Denies dryness, itching, and rash. Neuro:  Denies numbness, poor balance, tingling,  and tremors.  Physical Exam  General:  Well-developed,well-nourished,in no acute distress; alert,appropriate and cooperative throughout examination Head:  Normocephalic and atraumatic without obvious abnormalities. No apparent alopecia or balding. Sinuses NT. Eyes:  Conjunctiva clear bilaterally.  Ears:  External ear exam shows no significant lesions or deformities.  Otoscopic examination reveals clear canals, tympanic membranes are intact bilaterally without bulging,  retraction, inflammation or discharge. Hearing is grossly normal bilaterally. Nose:  External nasal examination shows no deformity or inflammation. Nasal mucosa are pink and moist without lesions or exudates. Mouth:  Oral mucosa and oropharynx without lesions or exudates.  Teeth in good repair. Neck:  No deformities, masses, or tenderness noted. Chest Wall:  No deformities, masses, tenderness or gynecomastia noted. Breasts:  No masses or gynecomastia noted Lungs:  Normal respiratory effort, chest expands symmetrically. Lungs are clear to auscultation, no crackles or wheezes. Heart:  Normal rate and regular rhythm. S1 and S2 normal without gallop, murmur, click, rub or other extra sounds. Abdomen:  Bowel sounds positive,abdomen soft and non-tender without masses, organomegaly or hernias noted. Rectal:  No external abnormalities noted. Normal sphincter tone. No rectal masses or tenderness. G neg. Genitalia:  Testes bilaterally descended without nodularity, tenderness or masses. No scrotal masses or lesions. No penis lesions or urethral discharge. Prostate:  Prostate gland firm and smooth, no enlargement, nodularity,  mass,or asymmetry. Does have tenderness and  induration, L>R.. 10-20gms. Msk:  No deformity or scoliosis noted of thoracic or lumbar spine.   Pulses:  R and L carotid,radial,femoral,dorsalis pedis and posterior tibial pulses are full and equal bilaterally Extremities:  No clubbing, cyanosis, edema, or deformity noted with normal full range of motion of all joints.   Neurologic:  No cranial nerve deficits noted. Station and gait are normal. Sensory, motor and coordinative functions appear intact. Skin:  Intact without suspicious lesions or rashes, mild inflasmmatory maculopapulart patches on ant knee surfaces and less inolved elbow surfaces. Cervical Nodes:  No lymphadenopathy noted Inguinal Nodes:  No significant adenopathy Psych:  Cognition and judgment appear intact. Alert and  cooperative with normal attention span and concentration. No apparent delusions, illusions, hallucinations   Impression & Recommendations:  Problem # 1:  HEALTH MAINTENANCE EXAM (ICD-V70.0)  Reviewed preventive care protocols, scheduled due services, and updated immunizations.  Problem # 2:  Hx of POLYCYTHEMIA (ICD-289.0) Assessment: Improved Stable WBC actually slightly ow this year.  Problem # 3:  BENIGN PROSTATIC HYPERTROPHY, HX OF (ICD-V13.8) Assessment: Improved Stable...uses Flomax occas.  Problem # 4:  SPECIAL SCREENING MALIGNANT NEOPLASM OF PROSTATE (ICD-V76.44) Assessment: Unchanged table PSA and exam.  Problem # 5:  HYPERLIPIDEMIA (ICD-272.4) Assessment: Improved Great control. Cont Diet as is. Labs Reviewed: SGOT: 19 (03/03/2010)   SGPT: 14 (03/03/2010)   HDL:53.80 (03/03/2010), 46.3 (01/10/2008)  LDL:107 (03/03/2010), 95 (36/64/4034)  Chol:168 (03/03/2010), 152 (01/10/2008)  Trig:37.0 (03/03/2010), 53 (01/10/2008)  Problem # 6:  PSORIASIS, MILD (ICD-696.1) Assessment: New Discussed using Cortaid.  Complete Medication List: 1)  Flomax 0.4 Mg Cp24 (Tamsulosin hcl) .Marland Kitchen.. 1 at bedtime 2)  Avodart 0.5 Mg Caps (Dutasteride) .Marland Kitchen.. 1 daily by mouth 3)  Multivitamins Tabs (Multiple vitamin) .Marland Kitchen.. 1 daily as needed by mouth 4)  Afrin Nasal Spray 0.05 % Soln (Oxymetazoline hcl) .... Otc as directed 5)  Clarinex 5 Mg Tabs (Desloratadine) .Marland Kitchen.. 1 once daily for nasal congestion 6)  Nasonex 50 Mcg/act Susp (Mometasone furoate) .... 2 sprays each nostril once daily 7)  Aspirin 81 Mg Tbec (Aspirin) .... Take one by mouth daily  Other  Orders: Tdap => 92yrs IM (23762) Admin 1st Vaccine (83151)  Patient Instructions: 1)  RTC as needed.  Current Allergies (reviewed today): No known allergies    Tetanus/Td Vaccine    Vaccine Type: Tdap    Site: left deltoid    Mfr: GlaxoSmithKline    Dose: 0.5 ml    Route: IM    Given by: Sydell Axon LPN    Exp. Date: 01/15/2012    Lot  #: [id number]    VIS given: 09/10/07 version given Mar 09, 2010.

## 2011-03-10 NOTE — Op Note (Signed)
[place]  Patient:    William Tucker, William Tucker                    MRN: [id number] Proc. Date: 03/28/00 Adm. Date:  [id number] Disc. Date: [id number] Attending:  Lauree Chandler CC:         Dr. Jackqulyn Livings                           Operative Report  PREOPERATIVE DIAGNOSIS:  Rule out interstitial cystitis.  Rule out urethral stricture.  POSTOPERATIVE DIAGNOSIS:  Rule out interstitial cystitis.  PROCEDURE:  Cystoscopy, hydraulic dilation of bladder and cold-cup bladder biopsy with fulguration.  SURGEON:  Maretta Bees. Vonita Moss, M.D.  ANESTHESIA:  General.  INDICATIONS:  This is a 45 year old white male, who has had a several year history of slow urination with straining to void, frequency and nocturia x two.  He has been tried on Cardura without any benefit.  He is brought to the OR today to rule out urethral stricture or possible interstitial cystitis. Previously, he had negative urinalysis and no significant residual urine.  PROCEDURE:  The patient was brought to the operating room and placed in lithotomy position.  External genitalia were prepped and draped in usual fashion.   He was cystoscoped.  The anterior urethra was normal with no evidence of stricture.  The prostatic urethra was unremarkable.  The bladder had no stones, tumors or inflammatory lesions.  The bladder was distended to 400 cc, which was his capacity and he developed a trabecular pattern with increased vascularity.  I then emptied the bladder, looked back in and he had scattered submucosal hemorrhage.  Two representative hemorrhagic areas were biopsied on the bladder base and sent to pathology.  The biopsy sites were fulgurated with the Bugbee electrode.  There was no significant blood loss. There was good hemostasis, the bladder was emptied and scope removed and patient sent to recovery room in good condition having tolerated the procedure well. DD:  03/28/00 TD:   03/31/00 Job: [id number]

## 2011-04-29 ENCOUNTER — Encounter: Payer: Self-pay | Admitting: Family Medicine

## 2011-05-10 ENCOUNTER — Other Ambulatory Visit: Payer: Self-pay | Admitting: Family Medicine

## 2011-05-10 DIAGNOSIS — E785 Hyperlipidemia, unspecified: Secondary | ICD-10-CM

## 2011-05-10 DIAGNOSIS — D751 Secondary polycythemia: Secondary | ICD-10-CM

## 2011-05-10 DIAGNOSIS — Z87898 Personal history of other specified conditions: Secondary | ICD-10-CM

## 2011-05-15 ENCOUNTER — Other Ambulatory Visit (INDEPENDENT_AMBULATORY_CARE_PROVIDER_SITE_OTHER): Payer: Self-pay | Admitting: Family Medicine

## 2011-05-15 DIAGNOSIS — D751 Secondary polycythemia: Secondary | ICD-10-CM

## 2011-05-15 DIAGNOSIS — E785 Hyperlipidemia, unspecified: Secondary | ICD-10-CM

## 2011-05-15 DIAGNOSIS — Z87898 Personal history of other specified conditions: Secondary | ICD-10-CM

## 2011-05-15 LAB — BASIC METABOLIC PANEL
BUN: 14 mg/dL (ref 6–23)
Calcium: 8.6 mg/dL (ref 8.4–10.5)
Chloride: 106 mEq/L (ref 96–112)
Glucose, Bld: 99 mg/dL (ref 70–99)
Sodium: 138 mEq/L (ref 135–145)

## 2011-05-15 LAB — HEPATIC FUNCTION PANEL
ALT: 14 U/L (ref 0–53)
Bilirubin, Direct: 0.1 mg/dL (ref 0.0–0.3)
Total Bilirubin: 0.8 mg/dL (ref 0.3–1.2)
Total Protein: 7 g/dL (ref 6.0–8.3)

## 2011-05-15 LAB — CBC WITH DIFFERENTIAL/PLATELET
Basophils Relative: 0.6 % (ref 0.0–3.0)
Eosinophils Absolute: 0.2 10*3/uL (ref 0.0–0.7)
Eosinophils Relative: 5.8 % — ABNORMAL HIGH (ref 0.0–5.0)
Hemoglobin: 15.2 g/dL (ref 13.0–17.0)
Lymphocytes Relative: 32.2 % (ref 12.0–46.0)
Lymphs Abs: 1.1 10*3/uL (ref 0.7–4.0)
MCHC: 34.2 g/dL (ref 30.0–36.0)
Neutro Abs: 1.8 10*3/uL (ref 1.4–7.7)
Neutrophils Relative %: 51 % (ref 43.0–77.0)
WBC: 3.5 10*3/uL — ABNORMAL LOW (ref 4.5–10.5)

## 2011-05-15 LAB — LIPID PANEL
Cholesterol: 137 mg/dL (ref 0–200)
HDL: 54.8 mg/dL (ref >39.00)
LDL Cholesterol: 73 mg/dL (ref 0–99)
VLDL: 9.2 mg/dL (ref 0.0–40.0)

## 2011-05-18 ENCOUNTER — Encounter: Payer: Self-pay | Admitting: Family Medicine

## 2011-05-18 ENCOUNTER — Ambulatory Visit (INDEPENDENT_AMBULATORY_CARE_PROVIDER_SITE_OTHER): Payer: 59 | Admitting: Family Medicine

## 2011-05-18 DIAGNOSIS — E785 Hyperlipidemia, unspecified: Secondary | ICD-10-CM

## 2011-05-18 DIAGNOSIS — J309 Allergic rhinitis, unspecified: Secondary | ICD-10-CM

## 2011-05-18 DIAGNOSIS — D751 Secondary polycythemia: Secondary | ICD-10-CM

## 2011-05-18 DIAGNOSIS — N401 Enlarged prostate with lower urinary tract symptoms: Secondary | ICD-10-CM

## 2011-05-18 NOTE — Progress Notes (Signed)
  Subjective:    Patient ID: William Tucker, male    DOB: October 27, 1965, 45 y.o.   MRN: [id number]  HPI Pt here for Comp Exam. He has no complaints.     Review of Systems  Constitutional: Negative for fever, chills, diaphoresis, appetite change, fatigue and unexpected weight change.  HENT: Positive for tinnitus (stable ). Negative for hearing loss, ear pain and ear discharge.   Eyes: Negative for pain, discharge and visual disturbance.       Starting to have presbyopia.  Respiratory: Negative for cough, shortness of breath and wheezing.   Cardiovascular: Negative for chest pain and palpitations.       No SOB w/ exertion  Gastrointestinal: Negative for nausea, vomiting, abdominal pain, diarrhea, constipation and blood in stool.       No heartburn or swallowing problems. Hemms get inflamed at times.  Genitourinary: Negative for dysuria, frequency and difficulty urinating.       No nocturia  Musculoskeletal: Negative for myalgias, back pain and arthralgias.  Skin: Negative for rash.       No itching or dryness.  Neurological: Negative for tremors and numbness.       No tingling or balance problems.  Hematological: Negative for adenopathy. Does not bruise/bleed easily.  Psychiatric/Behavioral: Negative for dysphoric mood and agitation.       Objective:   Physical Exam  Constitutional: He is oriented to person, place, and time. He appears well-developed and well-nourished. No distress.  HENT:  Head: Normocephalic and atraumatic.  Right Ear: External ear normal.  Left Ear: External ear normal.  Nose: Nose normal.  Mouth/Throat: Oropharynx is clear and moist.  Eyes: Conjunctivae and EOM are normal. Pupils are equal, round, and reactive to light. Right eye exhibits no discharge. Left eye exhibits no discharge. No scleral icterus.  Neck: Normal range of motion. Neck supple. No thyromegaly present.  Cardiovascular: Normal rate, regular rhythm, normal heart sounds and intact distal  pulses.   No murmur heard. Pulmonary/Chest: Effort normal and breath sounds normal. No respiratory distress. He has no wheezes.  Abdominal: Soft. Bowel sounds are normal. He exhibits no distension and no mass. There is no tenderness. There is no rebound and no guarding.  Genitourinary: Rectum normal, prostate normal and penis normal. Guaiac negative stool.       Prostate 30gms, smooth and firm, raphe intact.   Musculoskeletal: Normal range of motion. He exhibits no edema.  Lymphadenopathy:    He has no cervical adenopathy.  Neurological: He is alert and oriented to person, place, and time. Coordination normal.  Skin: Skin is warm and dry. No rash noted. He is not diaphoretic.  Psychiatric: He has a normal mood and affect. His behavior is normal. Judgment and thought content normal.          Assessment & Plan:  HMPE

## 2011-05-18 NOTE — Assessment & Plan Note (Signed)
Grteat nos. Cont exercise and diet control.

## 2011-05-18 NOTE — Assessment & Plan Note (Signed)
Sxs cont to do well on Avodart nad Flomax. Continue.

## 2011-05-18 NOTE — Assessment & Plan Note (Signed)
H/H nml. WBC slightly low this year. Repeat next year and poss investigate if still low.

## 2011-05-18 NOTE — Assessment & Plan Note (Signed)
Stable sxs on Nasonex. Cont as needed.

## 2011-08-14 ENCOUNTER — Other Ambulatory Visit: Payer: Self-pay | Admitting: *Deleted

## 2011-08-14 MED ORDER — TAMSULOSIN HCL 0.4 MG PO CAPS: 0.4000 mg | ORAL_CAPSULE | Freq: Every day | ORAL | Status: DC

## 2011-10-23 ENCOUNTER — Other Ambulatory Visit: Payer: Self-pay | Admitting: Family Medicine

## 2011-10-23 NOTE — Telephone Encounter (Signed)
Request sent electronically.  Is this quantity correct?

## 2011-10-24 NOTE — Telephone Encounter (Signed)
I doubt that it is.  Please verify with patient.  I 'no printed' for 90/3RF.  Please send in per patient request.

## 2011-10-25 MED ORDER — DUTASTERIDE 0.5 MG PO CAPS: 0.5000 mg | ORAL_CAPSULE | Freq: Every day | ORAL | Status: DC

## 2011-10-25 MED ORDER — TAMSULOSIN HCL 0.4 MG PO CAPS: 0.4000 mg | ORAL_CAPSULE | Freq: Every day | ORAL | Status: DC

## 2011-10-25 NOTE — Telephone Encounter (Signed)
Patient returned call.  Does take medication once daily.  Rx sent in along with Flomax Rx at patient's request.

## 2011-10-25 NOTE — Telephone Encounter (Signed)
LMOVM of patient's mobile/work phone to return call re: verifying dosage.

## 2012-10-08 ENCOUNTER — Encounter: Payer: Self-pay | Admitting: Family Medicine

## 2012-10-08 ENCOUNTER — Ambulatory Visit (INDEPENDENT_AMBULATORY_CARE_PROVIDER_SITE_OTHER): Payer: 59 | Admitting: Family Medicine

## 2012-10-08 VITALS — BP 122/84 | HR 84 | Temp 98.0°F | Ht 69.0 in | Wt 168.8 lb

## 2012-10-08 DIAGNOSIS — E0500 Thyrotoxicosis with diffuse goiter without thyrotoxic crisis or storm: Secondary | ICD-10-CM | POA: Insufficient documentation

## 2012-10-08 DIAGNOSIS — E785 Hyperlipidemia, unspecified: Secondary | ICD-10-CM

## 2012-10-08 DIAGNOSIS — N401 Enlarged prostate with lower urinary tract symptoms: Secondary | ICD-10-CM

## 2012-10-08 DIAGNOSIS — Z0000 Encounter for general adult medical examination without abnormal findings: Secondary | ICD-10-CM | POA: Insufficient documentation

## 2012-10-08 DIAGNOSIS — Z1211 Encounter for screening for malignant neoplasm of colon: Secondary | ICD-10-CM

## 2012-10-08 DIAGNOSIS — R946 Abnormal results of thyroid function studies: Secondary | ICD-10-CM

## 2012-10-08 DIAGNOSIS — J309 Allergic rhinitis, unspecified: Secondary | ICD-10-CM

## 2012-10-08 DIAGNOSIS — D72819 Decreased white blood cell count, unspecified: Secondary | ICD-10-CM

## 2012-10-08 DIAGNOSIS — R7989 Other specified abnormal findings of blood chemistry: Secondary | ICD-10-CM

## 2012-10-08 DIAGNOSIS — E0590 Thyrotoxicosis, unspecified without thyrotoxic crisis or storm: Secondary | ICD-10-CM

## 2012-10-08 LAB — CBC WITH DIFFERENTIAL/PLATELET
Basophils Absolute: 0 10*3/uL (ref 0.0–0.1)
Eosinophils Relative: 6.9 % — ABNORMAL HIGH (ref 0.0–5.0)
HCT: 47.5 % (ref 39.0–52.0)
Hemoglobin: 16.3 g/dL (ref 13.0–17.0)
Lymphocytes Relative: 40.8 % (ref 12.0–46.0)
Monocytes Relative: 9.9 % (ref 3.0–12.0)
Neutro Abs: 1.5 10*3/uL (ref 1.4–7.7)
RBC: 5.3 Mil/uL (ref 4.22–5.81)
RDW: 13 % (ref 11.5–14.6)
WBC: 3.7 10*3/uL — ABNORMAL LOW (ref 4.5–10.5)

## 2012-10-08 LAB — BASIC METABOLIC PANEL
Calcium: 9.4 mg/dL (ref 8.4–10.5)
GFR: 85.97 mL/min (ref >60.00)
Glucose, Bld: 80 mg/dL (ref 70–99)
Potassium: 4.2 mEq/L (ref 3.5–5.1)
Sodium: 138 mEq/L (ref 135–145)

## 2012-10-08 LAB — TSH: TSH: 0.02 u[IU]/mL — ABNORMAL LOW (ref 0.35–5.50)

## 2012-10-08 LAB — POC HEMOCCULT BLD/STL (OFFICE/1-CARD/DIAGNOSTIC): Fecal Occult Blood, POC: NEGATIVE

## 2012-10-08 LAB — T3, FREE: T3, Free: 3.5 pg/mL (ref 2.3–4.2)

## 2012-10-08 MED ORDER — FINASTERIDE 5 MG PO TABS: 5.0000 mg | ORAL_TABLET | Freq: Every day | ORAL | Status: DC

## 2012-10-08 NOTE — Progress Notes (Signed)
Subjective:    Patient ID: William Tucker, male    DOB: 01-May-1966, 46 y.o.   MRN: [id number]  HPI CC: annual  Transfer of care from Dr. Hetty Ely, presents for annual physical today.  H/o BPH - on avodart and flomax.  Has been on meds for 5 yrs now.  Much improvement in stream.  Minimal nocturia.  If stops flomax, notices trouble voiding after a few days.  No dysuria, urgency, hematuria.  Has seen uro in past x1.  Noticing needing readers 2/2 vision changes.  Hyperthyroid - noted TSH 0.04 04/2011.  Denies h/o thyroid problems.  Denies heat/cold intolerance, diarrhea/constipation, skin or hair changes or weight changes.  Will recheck today.  Preventative: Flu shot - declines Tetanus - 2011 Prostate - gets checked yearly 2/2 alpha blocker and 5a reductase inhibitor Colon - had colonoscopy 2009 WNL for fmhx, likely ok to rpt at age 42yo.  Caffeine: 3-4 cups coffee/day Married and lives with wife and 2 sons Occupation: Patent examiner Activity: walks several miles daily Diet: good water, fruits/vegetables daily  Medications and allergies reviewed and updated in chart.  Past histories reviewed and updated if relevant as below. Patient Active Problem List  Diagnosis  . HYPERLIPIDEMIA  . ALLERGIC RHINITIS  . HYPERTROPHY PROSTATE W/UR OBST & OTH LUTS  . PSORIASIS, MILD  . ALLERGY   Past Medical History  Diagnosis Date  . Perennial allergic rhinitis     eval by Dr. Jenne Pane with ENT 2011  . BPH (benign prostatic hypertrophy)     on proscar and flomax (saw Dr. Vonita Moss)   Past Surgical History  Procedure Date  . Tonsillectomy as a child  . Cystoscopy 03/28/2000    biopsy negative for ICS  . Colonoscopy 2009    WNL, rec rpt 10 yrs   History  Substance Use Topics  . Smoking status: Never Smoker   . Smokeless tobacco: Former Neurosurgeon  . Alcohol Use: 2.0 oz/week    4 drink(s) per week     Comment: couple of times a week   Family History  Problem Relation Age of Onset  .  Hyperlipidemia Mother   . Hyperlipidemia Father   . Hypertension Father   . Cancer Paternal Grandmother 81    colon  . Colon polyps Mother   . Diabetes Neg Hx   . Stroke Maternal Grandmother   . Stroke Maternal Uncle   . CAD Paternal Grandfather     small MI   No Known Allergies Current Outpatient Prescriptions on File Prior to Visit  Medication Sig Dispense Refill  . aspirin 81 MG tablet Take 81 mg by mouth every other day.       . loratadine (CLARITIN) 10 MG tablet Take 10 mg by mouth daily.        . mometasone (NASONEX) 50 MCG/ACT nasal spray Place 2 sprays into the nose daily as needed.       . Multiple Vitamin (MULTIVITAMIN) tablet Take 1 tablet by mouth daily.        . Tamsulosin HCl (FLOMAX) 0.4 MG CAPS Take 1 capsule (0.4 mg total) by mouth daily.  90 capsule  2     Review of Systems  Constitutional: Negative for fever, chills, activity change, appetite change, fatigue and unexpected weight change.  HENT: Negative for hearing loss and neck pain.   Eyes: Negative for visual disturbance.  Respiratory: Negative for cough, chest tightness, shortness of breath and wheezing.   Cardiovascular: Negative for chest pain, palpitations and leg  swelling.  Gastrointestinal: Negative for nausea, vomiting, abdominal pain, diarrhea, constipation, blood in stool and abdominal distention.  Genitourinary: Negative for hematuria and difficulty urinating.  Musculoskeletal: Negative for myalgias and arthralgias.  Skin: Negative for rash.  Neurological: Negative for dizziness, seizures, syncope and headaches.  Hematological: Does not bruise/bleed easily.  Psychiatric/Behavioral: Negative for dysphoric mood. The patient is not nervous/anxious.        Objective:   Physical Exam  Nursing note and vitals reviewed. Constitutional: He is oriented to person, place, and time. He appears well-developed and well-nourished. No distress.  HENT:  Head: Normocephalic and atraumatic.  Right Ear:  Hearing, tympanic membrane, external ear and ear canal normal.  Left Ear: Hearing, tympanic membrane, external ear and ear canal normal.  Nose: Nose normal.  Mouth/Throat: Oropharynx is clear and moist. No oropharyngeal exudate.  Eyes: Conjunctivae normal and EOM are normal. Pupils are equal, round, and reactive to light. No scleral icterus.  Neck: Normal range of motion. Neck supple. No thyromegaly present.  Cardiovascular: Normal rate, regular rhythm, normal heart sounds and intact distal pulses.   No murmur heard. Pulses:      Radial pulses are 2+ on the right side, and 2+ on the left side.  Pulmonary/Chest: Effort normal and breath sounds normal. No respiratory distress. He has no wheezes. He has no rales.  Abdominal: Soft. Bowel sounds are normal. He exhibits no distension and no mass. There is no tenderness. There is no rebound and no guarding.  Genitourinary: Rectum normal and prostate normal. Rectal exam shows no external hemorrhoid, no internal hemorrhoid, no fissure, no mass, no tenderness and anal tone normal. Guaiac negative stool. Prostate is not enlarged (15-20gm) and not tender.  Musculoskeletal: Normal range of motion. He exhibits no edema.  Lymphadenopathy:    He has no cervical adenopathy.  Neurological: He is alert and oriented to person, place, and time.       CN grossly intact, station and gait intact  Skin: Skin is warm and dry. No rash noted.  Psychiatric: He has a normal mood and affect. His behavior is normal. Judgment and thought content normal.       Assessment & Plan:

## 2012-10-08 NOTE — Patient Instructions (Signed)
Blood work today. We will recheck thyroid hormone today and will call you if we need more blood work or further testing. Good to see you, call us with questions. Return as needed or in 1 year for next physical. I've changed med to proscar (finasteride) and sent in to [facility].

## 2012-10-08 NOTE — Assessment & Plan Note (Signed)
?  h/o secondary polycythemia vera? Has had mildly low WBC over last several years - recheck today.

## 2012-10-08 NOTE — Assessment & Plan Note (Signed)
Preventative protocols reviewed and updated unless pt declined. Discussed healthy diet and lifestyle.  

## 2012-10-08 NOTE — Assessment & Plan Note (Signed)
Reviewed #s from 04/2011, normal . Did not recheck today.

## 2012-10-08 NOTE — Assessment & Plan Note (Signed)
Concern for hyperthyroidism as of last blood work 04/2011 .  recheck today along with anti-TSH receptor Ab to eval for graves. If abnormal, check thyroid uptake scan and consider endo referral.

## 2012-10-08 NOTE — Assessment & Plan Note (Signed)
Very stable on current regimen. Changed from avodart to proscar 2/2 cost.  Continue flomax.

## 2012-10-10 ENCOUNTER — Other Ambulatory Visit: Payer: Self-pay | Admitting: Family Medicine

## 2012-10-10 DIAGNOSIS — E0590 Thyrotoxicosis, unspecified without thyrotoxic crisis or storm: Secondary | ICD-10-CM

## 2012-10-11 ENCOUNTER — Other Ambulatory Visit: Payer: Self-pay | Admitting: Family Medicine

## 2012-10-11 ENCOUNTER — Other Ambulatory Visit (HOSPITAL_COMMUNITY): Payer: Self-pay

## 2012-10-11 NOTE — Addendum Note (Signed)
Addended by: Eustaquio Boyden on: 10/11/2012 02:09 PM   Modules accepted: Orders

## 2012-10-14 LAB — THYROTROPIN RECEPTOR AUTOABS: Thyrotropin Receptor Ab: 16.9 % — ABNORMAL HIGH (ref <=16.0)

## 2012-11-04 ENCOUNTER — Encounter (HOSPITAL_COMMUNITY)
Admission: RE | Admit: 2012-11-04 | Discharge: 2012-11-04 | Disposition: A | Discharge status: Home / Self Care | Payer: 59 | Source: Ambulatory Visit | Attending: Family Medicine | Admitting: Family Medicine

## 2012-11-04 DIAGNOSIS — E0590 Thyrotoxicosis, unspecified without thyrotoxic crisis or storm: Secondary | ICD-10-CM | POA: Insufficient documentation

## 2012-11-05 ENCOUNTER — Encounter (HOSPITAL_COMMUNITY)
Admission: RE | Admit: 2012-11-05 | Discharge: 2012-11-05 | Disposition: A | Discharge status: Home / Self Care | Payer: 59 | Source: Ambulatory Visit | Attending: Family Medicine | Admitting: Family Medicine

## 2012-11-05 MED ORDER — SODIUM PERTECHNETATE TC 99M INJECTION
9.9000 | Freq: Once | INTRAVENOUS | Status: AC | PRN
  Administered 2012-11-05: 10 via INTRAVENOUS

## 2012-11-05 MED ORDER — SODIUM IODIDE I 131 CAPSULE
9.9000 | Freq: Once | INTRAVENOUS | Status: AC | PRN
  Administered 2012-11-04: 9.9 via ORAL

## 2012-11-11 ENCOUNTER — Encounter: Payer: Self-pay | Admitting: Family Medicine

## 2012-11-11 ENCOUNTER — Telehealth: Payer: Self-pay

## 2012-11-11 ENCOUNTER — Other Ambulatory Visit: Payer: Self-pay | Admitting: Family Medicine

## 2012-11-11 DIAGNOSIS — R946 Abnormal results of thyroid function studies: Secondary | ICD-10-CM

## 2012-11-11 NOTE — Telephone Encounter (Signed)
Spoke with patient.

## 2012-11-11 NOTE — Telephone Encounter (Signed)
Pt returning call. Call pt [phone].

## 2012-11-18 ENCOUNTER — Encounter: Payer: Self-pay | Admitting: *Deleted

## 2012-11-18 ENCOUNTER — Telehealth: Payer: Self-pay | Admitting: *Deleted

## 2012-11-18 NOTE — Telephone Encounter (Signed)
Noted. Thanks.

## 2012-11-18 NOTE — Telephone Encounter (Signed)
Patient called and wanted to let you know that he found out his mother had some thyroid issues that required some iodine treatments. Once she had those, it took care of her issues. He wanted you to be aware incase it made any difference in his treatment. You also see her. Her chart # is [id number] if you want to look into things any further.

## 2012-11-20 ENCOUNTER — Ambulatory Visit
Admission: RE | Admit: 2012-11-20 | Discharge: 2012-11-20 | Disposition: A | Discharge status: Home / Self Care | Payer: 59 | Source: Ambulatory Visit | Attending: Family Medicine | Admitting: Family Medicine

## 2012-11-20 DIAGNOSIS — R946 Abnormal results of thyroid function studies: Secondary | ICD-10-CM

## 2013-01-27 ENCOUNTER — Ambulatory Visit (INDEPENDENT_AMBULATORY_CARE_PROVIDER_SITE_OTHER): Payer: 59 | Admitting: Family Medicine

## 2013-01-27 ENCOUNTER — Encounter: Payer: Self-pay | Admitting: Family Medicine

## 2013-01-27 VITALS — BP 130/88 | HR 76 | Temp 97.7°F

## 2013-01-27 DIAGNOSIS — E0590 Thyrotoxicosis, unspecified without thyrotoxic crisis or storm: Secondary | ICD-10-CM

## 2013-01-27 NOTE — Assessment & Plan Note (Signed)
Discussed dx - discussed options of referral to endo vs monitoring with routine TFTs. Pt opts to monitor for now.  Start with TFTs Q4 mo, if stable, will space out to Q6 mo. Anticipate genetic predisposition to graves disease, mildly elevated thyrotropin receptor Ab.

## 2013-01-27 NOTE — Patient Instructions (Signed)
Blood work today. If normal, return in 4 months for lab visit to recheck. We will monitor thyroid for now.  If changing, may refer you to endocrinologist.  Hyperthyroidism The thyroid is a large gland located in the lower front part of your neck. The thyroid helps control metabolism. Metabolism is how your body uses food. It controls metabolism with the hormone thyroxine. When the thyroid is overactive, it produces too much hormone. When this happens, these following problems may occur:   Nervousness  Heat intolerance  Weight loss (in spite of increase food intake)  Diarrhea  Change in hair or skin texture  Palpitations (heart skipping or having extra beats)  Tachycardia (rapid heart rate)  Loss of menstruation (amenorrhea)  Shaking of the hands CAUSES  Grave's Disease (the immune system attacks the thyroid gland). This is the most common cause.  Inflammation of the thyroid gland.  Tumor (usually benign) in the thyroid gland or elsewhere.  Excessive use of thyroid medications (both prescription and 'natural').  Excessive ingestion of Iodine. DIAGNOSIS  To prove hyperthyroidism, your caregiver may do blood tests and ultrasound tests. Sometimes the signs are hidden. It may be necessary for your caregiver to watch this illness with blood tests, either before or after diagnosis and treatment. TREATMENT Short-term treatment There are several treatments to control symptoms. Drugs called beta blockers may give some relief. Drugs that decrease hormone production will provide temporary relief in many people. These measures will usually not give permanent relief. Definitive therapy There are treatments available which can be discussed between you and your caregiver which will permanently treat the problem. These treatments range from surgery (removal of the thyroid), to the use of radioactive iodine (destroys the thyroid by radiation), to the use of antithyroid drugs (interfere with  hormone synthesis). The first two treatments are permanent and usually successful. They most often require hormone replacement therapy for life. This is because it is impossible to remove or destroy the exact amount of thyroid required to make a person euthyroid (normal). HOME CARE INSTRUCTIONS  See your caregiver if the problems you are being treated for get worse. Examples of this would be the problems listed above. SEEK MEDICAL CARE IF: Your general condition worsens. MAKE SURE YOU:   Understand these instructions.  Will watch your condition.  Will get help right away if you are not doing well or get worse. Document Released: 10/09/2005 Document Revised: 01/01/2012 Document Reviewed: 02/20/2007 [facility] Patient Information 2013 [place], [place].

## 2013-01-27 NOTE — Progress Notes (Signed)
Subjective:    Patient ID: William Tucker, male    DOB: [birth year], 47 y.o.   MRN: [id number]  HPI CC: f/u thyroid  At physical last year, found to have persistently elevated thyroid with TSH 0.02, however normal free T4 at 1.12, free T3 at 3.5. Thyrotropin receptor antibody mildly elevated at 16.9 Thyroid uptake scan at upper limits of normal with uptake of 29.3%, ?cold nodule Thyroid US returned with 2 tiny subcentimeter nodules.  No weight changes, no heat/cold intolerance, no palpitations, no diarrhea, no skin or hair changes.  THYROID SCAN AND UPTAKE - 24 HOURS  Technique: Following the per oral administration of I-131 sodium  iodide, the patient returned at 24 hours and uptake measurements  were acquired with the uptake probe centered on the neck. Thyroid  imaging was performed following the intravenous administration of  the Tc-34m Pertechnetate.  Radiopharmaceuticals: 9.9 uCi I-131 Sodium Iodide and 9.9 mCi TC-  63m Pertechnetate  Comparison: None  Findings:  24-hour radioiodine uptake is calculated at 29.3%.  Images of the thyroid gland in three projections demonstrate  asymmetry of the sizes of the thyroid lobes, right larger than  left.  Questionable tiny cold nodule at inferior pole of left lobe versus  artifact.  No other focal areas of increased or decreased tracer localization  seen.  IMPRESSION:  Upper normal 24-hour radioiodine uptake of 29.3%.  Questionable tiny cold nodule versus artifact at inferior pole left  lobe.  Original Report Authenticated By: Ulyses Southward, M.D.  THYROID ULTRASOUND  Technique: Ultrasound examination of the thyroid gland and adjacent  soft tissues was performed.  Comparison: Nuclear medicine thyroid scan 11/05/2012. Chest CT  10/29/2006.  Findings:  Right thyroid lobe: 5.6 x 1.6 x 2.0 cm.  Left thyroid lobe: 4.4 x 1.6 x 1.6 cm.  Isthmus: 3 mm in thickness.  Focal nodules: The gland is diffusely heterogeneous in  echotexture.  Inferiorly in the left lobe, there are two small  adjacent solid nodules. These measure 5 and 7 mm maximally. There  are no suspicious morphologic characteristics.  Lymphadenopathy: None visualized.  IMPRESSION:  Thyroid heterogeneity with two small nodules inferiorly on the  left. Findings do not meet current SRU consensus criteria for  biopsy. Follow-up by clinical exam is recommended. If patient has  known risk factors for thyroid carcinoma, consider follow-up  ultrasound in 12 months.  Original Report Authenticated By: Carey Bullocks, M.D.  Medications and allergies reviewed and updated in chart.  Past histories reviewed and updated if relevant as below. Patient Active Problem List  Diagnosis  . HYPERLIPIDEMIA  . ALLERGIC RHINITIS  . BPH (benign prostatic hypertrophy) with urinary obstruction  . PSORIASIS, MILD  . Subclinical hyperthyroidism  . Leukopenia  . Healthcare maintenance   Past Medical History  Diagnosis Date  . Perennial allergic rhinitis     eval by Dr. Jenne Pane with ENT 2011  . BPH (benign prostatic hypertrophy)     on proscar and flomax (saw Dr. Vonita Moss)  . Subclinical hyperthyroidism     Graves (upper normal thyroid uptake, mildly elevated TSH R Ab)   Past Surgical History  Procedure Laterality Date  . Tonsillectomy  as a child  . Cystoscopy  03/28/2000    biopsy negative for ICS  . Colonoscopy  2009    WNL, rec rpt 10 yrs   History  Substance Use Topics  . Smoking status: Never Smoker   . Smokeless tobacco: Former Neurosurgeon  . Alcohol Use: 2.0 oz/week    4  drink(s) per week     Comment: couple of times a week   Family History  Problem Relation Age of Onset  . Hyperlipidemia Mother   . Hyperlipidemia Father   . Hypertension Father   . Cancer Paternal Grandmother 52    colon  . Colon polyps Mother   . Diabetes Neg Hx   . Stroke Maternal Grandmother   . Stroke Maternal Uncle   . CAD Paternal Grandfather     small MI  . Thyroid disease Mother      s/p iodine treatment   No Known Allergies Current Outpatient Prescriptions on File Prior to Visit  Medication Sig Dispense Refill  . aspirin 81 MG tablet Take 81 mg by mouth every other day.       . cholecalciferol (VITAMIN D) 1000 UNITS tablet Take 1,000 Units by mouth daily.      . finasteride (PROSCAR) 5 MG tablet Take 1 tablet (5 mg total) by mouth daily.  90 tablet  3  . loratadine (CLARITIN) 10 MG tablet Take 10 mg by mouth daily.        . mometasone (NASONEX) 50 MCG/ACT nasal spray Place 2 sprays into the nose daily as needed.       . Multiple Vitamin (MULTIVITAMIN) tablet Take 1 tablet by mouth daily.        . Tamsulosin HCl (FLOMAX) 0.4 MG CAPS Take 1 capsule (0.4 mg total) by mouth daily.  90 capsule  2   No current facility-administered medications on file prior to visit.    Review of Systems Per HPI    Objective:   Physical Exam  Nursing note and vitals reviewed. Constitutional: He appears well-developed and well-nourished. No distress.  HENT:  Head: Normocephalic and atraumatic.  Mouth/Throat: Oropharynx is clear and moist. No oropharyngeal exudate.  Neck: Trachea normal and normal range of motion. Neck supple. Carotid bruit is not present. No thyromegaly present.  Cardiovascular: Normal rate, regular rhythm, normal heart sounds and intact distal pulses.   No murmur heard. Pulmonary/Chest: Effort normal and breath sounds normal. No respiratory distress. He has no wheezes. He has no rales.  Lymphadenopathy:    He has no cervical adenopathy.      Assessment & Plan:

## 2013-01-28 ENCOUNTER — Other Ambulatory Visit: Payer: Self-pay | Admitting: Family Medicine

## 2013-01-28 ENCOUNTER — Encounter: Payer: Self-pay | Admitting: *Deleted

## 2013-01-28 DIAGNOSIS — E0590 Thyrotoxicosis, unspecified without thyrotoxic crisis or storm: Secondary | ICD-10-CM

## 2013-04-28 ENCOUNTER — Telehealth: Payer: Self-pay

## 2013-04-28 NOTE — Telephone Encounter (Signed)
Finasteride will be generic alternative to avodart.  plz clarify this with patient.

## 2013-04-28 NOTE — Telephone Encounter (Signed)
Pt request generic substitute for Avodart due to expense. Pt is also taking Tamsulosin and when finishes present prescription will start on Finasteride. Pt request cb. [facility].

## 2013-04-29 NOTE — Telephone Encounter (Signed)
Both meds (finasteride and flomax) are for BPH, work different ways.  Pt may try trial off flomax once starts finasteride, but if noticing trouble with voiding, would recommend restart flomax.

## 2013-04-29 NOTE — Telephone Encounter (Signed)
Spoke to patient and was advised that he was ordinally taking Avodart for the prostate and Flomax for the flow. Patient is unclear about the Finasteride being a generic alternative to Avodart. Patient wants to know if he is to continue to take the Tamsulosin along with the Finasteride or does he just need to be taking only Finasteride? Patient states that the mail order pharmacy may be requesting a refill on the Avodart and he does not want to continue to take that because it is so expensive and wants that refill denied if it comes in. Patient stated that he is going to call the mail order pharmacy and tell them not to request a refill on it and verify if Finasteride is an alternative to Avodart.

## 2013-04-29 NOTE — Telephone Encounter (Signed)
Patient notified as instructed by telephone. 

## 2013-05-06 ENCOUNTER — Other Ambulatory Visit: Payer: Self-pay | Admitting: *Deleted

## 2013-05-06 MED ORDER — TAMSULOSIN HCL 0.4 MG PO CAPS: 0.4000 mg | ORAL_CAPSULE | Freq: Every day | ORAL | Status: DC

## 2013-09-01 ENCOUNTER — Other Ambulatory Visit: Payer: Self-pay | Admitting: *Deleted

## 2013-09-01 MED ORDER — TAMSULOSIN HCL 0.4 MG PO CAPS: 0.4000 mg | ORAL_CAPSULE | Freq: Every day | ORAL | Status: DC

## 2013-10-04 ENCOUNTER — Other Ambulatory Visit: Payer: Self-pay | Admitting: Family Medicine

## 2013-10-04 DIAGNOSIS — E0590 Thyrotoxicosis, unspecified without thyrotoxic crisis or storm: Secondary | ICD-10-CM

## 2013-10-04 DIAGNOSIS — E785 Hyperlipidemia, unspecified: Secondary | ICD-10-CM

## 2013-10-04 DIAGNOSIS — N138 Other obstructive and reflux uropathy: Secondary | ICD-10-CM

## 2013-10-04 DIAGNOSIS — D72819 Decreased white blood cell count, unspecified: Secondary | ICD-10-CM

## 2013-10-08 ENCOUNTER — Other Ambulatory Visit (INDEPENDENT_AMBULATORY_CARE_PROVIDER_SITE_OTHER): Payer: 59

## 2013-10-08 DIAGNOSIS — E785 Hyperlipidemia, unspecified: Secondary | ICD-10-CM

## 2013-10-08 DIAGNOSIS — N138 Other obstructive and reflux uropathy: Secondary | ICD-10-CM

## 2013-10-08 DIAGNOSIS — N139 Obstructive and reflux uropathy, unspecified: Secondary | ICD-10-CM

## 2013-10-08 DIAGNOSIS — D72819 Decreased white blood cell count, unspecified: Secondary | ICD-10-CM

## 2013-10-08 DIAGNOSIS — N401 Enlarged prostate with lower urinary tract symptoms: Secondary | ICD-10-CM

## 2013-10-08 DIAGNOSIS — E0590 Thyrotoxicosis, unspecified without thyrotoxic crisis or storm: Secondary | ICD-10-CM

## 2013-10-08 LAB — CBC WITH DIFFERENTIAL/PLATELET
Basophils Absolute: 0 10*3/uL (ref 0.0–0.1)
Eosinophils Absolute: 0.3 10*3/uL (ref 0.0–0.7)
Eosinophils Relative: 4.5 % (ref 0.0–5.0)
HCT: 46.9 % (ref 39.0–52.0)
Lymphs Abs: 1.1 10*3/uL (ref 0.7–4.0)
MCHC: 33.8 g/dL (ref 30.0–36.0)
MCV: 87.9 fl (ref 78.0–100.0)
Monocytes Absolute: 0.6 10*3/uL (ref 0.1–1.0)
Monocytes Relative: 8.1 % (ref 3.0–12.0)
Neutrophils Relative %: 71.4 % (ref 43.0–77.0)
Platelets: 186 10*3/uL (ref 150.0–400.0)
RDW: 12.9 % (ref 11.5–14.6)
WBC: 7.3 10*3/uL (ref 4.5–10.5)

## 2013-10-08 LAB — BASIC METABOLIC PANEL
BUN: 16 mg/dL (ref 6–23)
CO2: 27 mEq/L (ref 19–32)
Calcium: 9.4 mg/dL (ref 8.4–10.5)
Chloride: 108 mEq/L (ref 96–112)
Creatinine, Ser: 0.9 mg/dL (ref 0.4–1.5)
GFR: 100.5 mL/min (ref >60.00)

## 2013-10-08 LAB — T4, FREE: Free T4: 2.18 ng/dL — ABNORMAL HIGH (ref 0.60–1.60)

## 2013-10-08 LAB — PSA: PSA: 0.22 ng/mL (ref 0.10–4.00)

## 2013-10-08 LAB — VITAMIN B12: Vitamin B-12: 171 pg/mL — ABNORMAL LOW (ref 211–911)

## 2013-10-13 ENCOUNTER — Encounter: Payer: Self-pay | Admitting: Family Medicine

## 2013-10-13 ENCOUNTER — Ambulatory Visit (INDEPENDENT_AMBULATORY_CARE_PROVIDER_SITE_OTHER): Payer: 59 | Admitting: Family Medicine

## 2013-10-13 VITALS — BP 116/78 | HR 88 | Temp 98.0°F | Ht 67.5 in | Wt 165.2 lb

## 2013-10-13 DIAGNOSIS — N401 Enlarged prostate with lower urinary tract symptoms: Secondary | ICD-10-CM

## 2013-10-13 DIAGNOSIS — E538 Deficiency of other specified B group vitamins: Secondary | ICD-10-CM

## 2013-10-13 DIAGNOSIS — N139 Obstructive and reflux uropathy, unspecified: Secondary | ICD-10-CM

## 2013-10-13 DIAGNOSIS — E0590 Thyrotoxicosis, unspecified without thyrotoxic crisis or storm: Secondary | ICD-10-CM

## 2013-10-13 DIAGNOSIS — N138 Other obstructive and reflux uropathy: Secondary | ICD-10-CM

## 2013-10-13 DIAGNOSIS — D72819 Decreased white blood cell count, unspecified: Secondary | ICD-10-CM

## 2013-10-13 DIAGNOSIS — Z0000 Encounter for general adult medical examination without abnormal findings: Secondary | ICD-10-CM

## 2013-10-13 MED ORDER — CYANOCOBALAMIN 1000 MCG/ML IJ SOLN
1000.0000 ug | Freq: Once | INTRAMUSCULAR | Status: AC
  Administered 2013-10-13: 1000 ug via INTRAMUSCULAR

## 2013-10-13 NOTE — Assessment & Plan Note (Signed)
Preventative protocols reviewed and updated unless pt declined. Discussed healthy diet and lifestyle.  PSA/DRE reassuring

## 2013-10-13 NOTE — Assessment & Plan Note (Signed)
Has now progressed to clinical hyperthryoidism - discussed treatment options - will refer to endocrinology per patient preference.

## 2013-10-13 NOTE — Assessment & Plan Note (Signed)
Seems resolved - will continue to monitor.

## 2013-10-13 NOTE — Progress Notes (Signed)
Pre-visit discussion using our clinic review tool. No additional management support is needed unless otherwise documented below in the visit note.  

## 2013-10-13 NOTE — Patient Instructions (Addendum)
Your thyroid is overactive. I'd like to refer you to our endocrinologist to discuss treatment options.  Pass by Marion's office to set this up. B12 shot today - start oral daily (over the counter) Good to see you today, call us with questions. Return to see me as needed or in 1 year for next physical.

## 2013-10-13 NOTE — Addendum Note (Signed)
Addended by: Desmond Dike on: 10/13/2013 09:37 AM   Modules accepted: Orders

## 2013-10-13 NOTE — Progress Notes (Addendum)
Subjective:    Patient ID: William Tucker, male    DOB: [birth year], 47 y.o.   MRN: [id number]  HPI CC: CPE  William Tucker presents today for wellness exam.  Now has 8-5 schedule.  Prior had rotating shifts.  H/o subclinical hyperthryoidism - we have been monitoring over last few years.  Now has developed clinical hyperthyroidism with elevated free T4 and T3 on latest blood work.  His appetite has increased but he has not noticed he has gained any weight.  Has not lost significant weight either.  Denies heat or cold intolerance, no skin or hair changes, no diarrhea/constipation  Had issue with left GTBursitis but this has improved with more padding of his work utility belt.  Wt Readings from Last 3 Encounters:  10/13/13 165 lb 4 oz (74.957 kg)  10/08/12 168 lb 12 oz (76.544 kg)  05/18/11 166 lb 4 oz (75.411 kg)    Preventative:  Prostate - gets checked yearly 2/2 alpha blocker and 5a reductase inhibitor  Colon - had colonoscopy 2009 WNL for fmhx, likely ok to rpt at age 29yo.  Flu shot - declines  Tetanus - 2011   Caffeine: 3-4 cups coffee/day Married and lives with wife and 2 sons Occupation: Patent examiner Activity: walks several miles daily Diet: good water, fruits/vegetables daily  Medications and allergies reviewed and updated in chart.  Past histories reviewed and updated if relevant as below. Patient Active Problem List   Diagnosis Date Noted  . Subclinical hyperthyroidism 10/08/2012  . Leukopenia 10/08/2012  . Healthcare maintenance 10/08/2012  . PSORIASIS, MILD 03/09/2010  . ALLERGIC RHINITIS 02/02/2010  . HYPERLIPIDEMIA 01/09/2008  . BPH (benign prostatic hypertrophy) with urinary obstruction 01/09/2008   Past Medical History  Diagnosis Date  . Perennial allergic rhinitis     eval by Dr. Jenne Pane with ENT 2011  . BPH (benign prostatic hypertrophy)     on proscar and flomax (saw Dr. Vonita Moss)  . Subclinical hyperthyroidism     Graves (upper normal thyroid uptake,  mildly elevated TSH R Ab)   Past Surgical History  Procedure Laterality Date  . Tonsillectomy  as a child  . Cystoscopy  03/28/2000    biopsy negative for ICS  . Colonoscopy  2009    WNL, rec rpt 10 yrs   History  Substance Use Topics  . Smoking status: Never Smoker   . Smokeless tobacco: Former Neurosurgeon  . Alcohol Use: 2.0 oz/week    4 drink(s) per week     Comment: couple of times a week   Family History  Problem Relation Age of Onset  . Hyperlipidemia Mother   . Hyperlipidemia Father   . Hypertension Father   . Cancer Paternal Grandmother 84    colon  . Colon polyps Mother   . Diabetes Neg Hx   . Stroke Maternal Grandmother   . Stroke Maternal Uncle   . CAD Paternal Grandfather     small MI  . Thyroid disease Mother     s/p iodine treatment   No Known Allergies Current Outpatient Prescriptions on File Prior to Visit  Medication Sig Dispense Refill  . aspirin 81 MG tablet Take 81 mg by mouth every other day.       . cholecalciferol (VITAMIN D) 1000 UNITS tablet Take 1,000 Units by mouth daily.      . finasteride (PROSCAR) 5 MG tablet Take 1 tablet (5 mg total) by mouth daily.  90 tablet  3  . loratadine (CLARITIN) 10 MG tablet  Take 10 mg by mouth daily.        . mometasone (NASONEX) 50 MCG/ACT nasal spray Place 2 sprays into the nose daily as needed.       . tamsulosin (FLOMAX) 0.4 MG CAPS capsule Take 1 capsule (0.4 mg total) by mouth daily.  90 capsule  1  . Multiple Vitamin (MULTIVITAMIN) tablet Take 1 tablet by mouth daily.         No current facility-administered medications on file prior to visit.      Review of Systems  Constitutional: Negative for fever, chills, activity change, appetite change, fatigue and unexpected weight change.  HENT: Negative for hearing loss.   Eyes: Negative for visual disturbance.  Respiratory: Negative for cough, chest tightness, shortness of breath and wheezing.   Cardiovascular: Negative for chest pain, palpitations and leg  swelling.  Gastrointestinal: Positive for blood in stool (intermittent). Negative for nausea, vomiting, abdominal pain, diarrhea, constipation and abdominal distention.  Genitourinary: Negative for hematuria and difficulty urinating.  Musculoskeletal: Negative for arthralgias, myalgias and neck pain.  Skin: Negative for rash.  Neurological: Negative for dizziness, seizures, syncope and headaches.  Hematological: Negative for adenopathy. Does not bruise/bleed easily.  Psychiatric/Behavioral: Negative for dysphoric mood. The patient is not nervous/anxious.        Objective:   Physical Exam  Nursing note and vitals reviewed. Constitutional: He is oriented to person, place, and time. He appears well-developed and well-nourished. No distress.  HENT:  Head: Normocephalic and atraumatic.  Right Ear: Hearing, tympanic membrane, external ear and ear canal normal.  Left Ear: Hearing, tympanic membrane, external ear and ear canal normal.  Nose: Nose normal.  Mouth/Throat: Oropharynx is clear and moist. No oropharyngeal exudate.  Eyes: Conjunctivae and EOM are normal. Pupils are equal, round, and reactive to light. No scleral icterus.  Neck: Normal range of motion. Neck supple. No thyromegaly present.  Cardiovascular: Normal rate, regular rhythm, normal heart sounds and intact distal pulses.   No murmur heard. Pulses:      Radial pulses are 2+ on the right side, and 2+ on the left side.  Hyperactive precordium  Pulmonary/Chest: Effort normal and breath sounds normal. No respiratory distress. He has no wheezes. He has no rales.  Abdominal: Soft. Bowel sounds are normal. He exhibits no distension and no mass. There is no tenderness. There is no rebound and no guarding. Hernia confirmed negative in the right inguinal area and confirmed negative in the left inguinal area.  Genitourinary: Rectum normal, prostate normal, testes normal and penis normal. Rectal exam shows no external hemorrhoid, no  internal hemorrhoid, no fissure, no mass, no tenderness and anal tone normal. Prostate is not enlarged (15gm) and not tender. Right testis shows no mass, no swelling and no tenderness. Right testis is descended. Left testis shows no mass, no swelling and no tenderness. Left testis is descended. Circumcised.  Musculoskeletal: Normal range of motion. He exhibits no edema.  Lymphadenopathy:    He has no cervical adenopathy.       Right: No inguinal adenopathy present.       Left: No inguinal adenopathy present.  Neurological: He is alert and oriented to person, place, and time.  CN grossly intact, station and gait intact  Skin: Skin is warm and dry. No rash noted.  Psychiatric: He has a normal mood and affect. His behavior is normal. Judgment and thought content normal.       Assessment & Plan:

## 2013-10-13 NOTE — Assessment & Plan Note (Signed)
Chronic, stable. continue meds. 

## 2013-10-13 NOTE — Assessment & Plan Note (Addendum)
checked for h/o leukopenia, found to be deficient - discussed vit b12 daily, start with shot today.

## 2013-10-17 ENCOUNTER — Ambulatory Visit (INDEPENDENT_AMBULATORY_CARE_PROVIDER_SITE_OTHER): Payer: 59 | Admitting: Internal Medicine

## 2013-10-17 ENCOUNTER — Encounter: Payer: Self-pay | Admitting: Internal Medicine

## 2013-10-17 VITALS — BP 118/74 | HR 100 | Temp 98.1°F | Ht 67.5 in | Wt 170.4 lb

## 2013-10-17 DIAGNOSIS — E0590 Thyrotoxicosis, unspecified without thyrotoxic crisis or storm: Secondary | ICD-10-CM

## 2013-10-17 MED ORDER — METHIMAZOLE 10 MG PO TABS: ORAL_TABLET | ORAL | Status: DC

## 2013-10-17 NOTE — Progress Notes (Signed)
Patient ID: William Tucker, male   DOB: [birth year], 47 y.o.   MRN: [id number]   HPI  William Tucker is a 47 y.o.-year-old male, referred by his PCP, Dr. Sharen Hones, for evaluation for thyrotoxycosis.  Pt has a h/o of subclinical hyperthyroidism for at least 2 years, but at last check this mo, he was found to have overt hyperthyroidism.   Pt has had a Thyroid Uptake and scan in 10/2012: Uptake 29%, at ULN, scan uniform. A thyroid U/S then showed a tiny nodule, no other defects.  I reviewed pt's thyroid tests: Lab Results  Component Value Date   TSH 0.01* 10/08/2013   TSH 0.02* 01/27/2013   TSH 0.02* 10/08/2012   TSH 0.04* 05/15/2011   TSH 0.49 03/03/2010   TSH 0.43 01/10/2008   FREET4 2.18* 10/08/2013   FREET4 1.43 01/27/2013   FREET4 1.12 10/08/2012   TR Ab 2013 : 16.9% (<16)  Pt denies feeling nodules in neck, hoarseness, dysphagia/odynophagia, SOB with lying down; he c/o: - no excessive sweating/heat intolerance - + R>L hand tremors, but tells me he drinks "too much coffee" >> 4 cups a day - no anxiety - no palpitations - no problems with concentration - no fatigue - no hyperdefecation - no weight loss or gained, but increased appetite >> "I eat everything"  Pt does have a FH of thyroid ds >> hyperthyroidism in mother - tx with RAI. No FH of thyroid cancer. No h/o radiation tx to head or neck.  No seaweed or kelp, no recent contrast studies. No steroid use. No herbal supplements.   I reviewed his chart and he also has a history of BPH - [facility], HL, psoriasis, leukopenia, vit B12 def.   ROS: Constitutional: no weight gain/loss, no fatigue, + increased appetite, no subjective hyperthermia/hypothermia Eyes: no blurry vision, no xerophthalmia ENT: no sore throat, no nodules palpated in throat, no dysphagia/odynophagia, no hoarseness Cardiovascular: no CP/SOB/palpitations/leg swelling Respiratory: no cough/SOB Gastrointestinal: no N/V/D/C Musculoskeletal: no muscle/joint  aches Skin: no rashes Neurological: + tremors/no numbness/tingling/dizziness Psychiatric: no depression/anxiety  Past Medical History  Diagnosis Date  . Perennial allergic rhinitis     eval by Dr. Jenne Pane with ENT 2011  . BPH (benign prostatic hypertrophy)     on proscar and flomax (saw Dr. Vonita Moss)  . Subclinical hyperthyroidism     Graves (upper normal thyroid uptake, mildly elevated TSH R Ab)   Past Surgical History  Procedure Laterality Date  . Tonsillectomy  as a child  . Cystoscopy  03/28/2000    biopsy negative for ICS  . Colonoscopy  2009    WNL, rec rpt 10 yrs   History   Social History  . Marital Status: Married    Spouse Name: N/A    Number of Children: 2  . Years of Education: N/A   Occupational History  . [employer]   Social History Main Topics  . Smoking status: Never Smoker   . Smokeless tobacco: Former Neurosurgeon  . Alcohol Use: 2.0 oz/week    4 drink(s) per week     Comment: couple of times a week  . Drug Use: No  . Sexual Activity: Yes   Other Topics Concern  . Not on file   Social History Narrative   Caffeine: 3-4 cups coffee/day   Married and lives with wife and 2 sons   Occupation: Patent examiner   Activity: walks several miles daily   Diet: good water, fruits/vegetables daily   Current Outpatient Prescriptions  on File Prior to Visit  Medication Sig Dispense Refill  . aspirin 81 MG tablet Take 81 mg by mouth every other day.       . cholecalciferol (VITAMIN D) 1000 UNITS tablet Take 1,000 Units by mouth daily.      . cyanocobalamin 1000 MCG tablet Take 1,000 mcg by mouth daily.      . finasteride (PROSCAR) 5 MG tablet Take 1 tablet (5 mg total) by mouth daily.  90 tablet  3  . loratadine (CLARITIN) 10 MG tablet Take 10 mg by mouth daily.        . mometasone (NASONEX) 50 MCG/ACT nasal spray Place 2 sprays into the nose daily as needed.       . Multiple Vitamin (MULTIVITAMIN) tablet Take 1 tablet by mouth daily.        .  tamsulosin (FLOMAX) 0.4 MG CAPS capsule Take 1 capsule (0.4 mg total) by mouth daily.  90 capsule  1   No current facility-administered medications on file prior to visit.   No Known Allergies Family History  Problem Relation Age of Onset  . Hyperlipidemia Mother   . Hyperlipidemia Father   . Hypertension Father   . Cancer Paternal Grandmother 73    colon  . Colon polyps Mother   . Diabetes Neg Hx   . Stroke Maternal Grandmother   . Stroke Maternal Uncle   . CAD Paternal Grandfather     small MI  . Thyroid disease Mother     s/p iodine treatment   PE: BP 118/74  Pulse 100  Temp(Src) 98.1 F (36.7 C) (Oral)  Ht 5' 7.5" (1.715 m)  Wt 170 lb 6 oz (77.282 kg)  BMI 26.28 kg/m2  SpO2 97% Wt Readings from Last 3 Encounters:  10/17/13 170 lb 6 oz (77.282 kg)  10/13/13 165 lb 4 oz (74.957 kg)  10/08/12 168 lb 12 oz (76.544 kg)   Constitutional: normal weight, in NAD Eyes: PERRLA, EOMI, no exophthalmos, no lid lag, no stare ENT: moist mucous membranes, no thyromegaly, no thyroid bruits, no cervical lymphadenopathy Cardiovascular: RRR, No MRG Respiratory: CTA B Gastrointestinal: abdomen soft, NT, ND, BS+ Musculoskeletal: no deformities, strength intact in all 4 Skin: moist, warm, no rashes Neurological: + tremor with outstretched hands R>L, DTR normal in all 4  ASSESSMENT: 1. Graves Ds.  PLAN:  1. Patient with long h/o low TSH, now with overt hyperthyroidism, without many thyrotoxic sxs: increased appetite w/o weight gain, tremors - he does not appear to have exogenous causes for the low TSH.  - We discussed that possible causes of thyrotoxicosis are:  Graves ds (most likely, especially with FH of HTyr, long evolution, and positive TRAb) Thyroiditis (unlikley in the light of long time with a suppressed TSH and uptake at ULN earlier this year) toxic multinodular goiter/ toxic adenoma (I cannot feel nodules at palpation of his thyroid, no nodules on U/S earlier this year)..   - we discussed about possible modalities of treatment for Graves ds to include methimazole use, radioactive iodine ablation or surgery. He decided for MMI. Patient Instructions  Please start Methimazole 10 mg in am and 5 mg in the evening, both with meals.  Please return for labs in 4 weeks. Please join MyChart for easier communication. Please stop the Methimazole (Tapazole) and call us or your primary care doctor if you develop: - sore throat - fever - yellow skin - dark urine - light colored stools As we will then need to check your  blood counts and liver tests. - I do not feel that we need to add beta blockers at this time, but I advised him to lewt me know if pulse >90 at rest at home - RTC in 3 months, but likely sooner for repeat labs

## 2013-10-17 NOTE — Patient Instructions (Addendum)
Please start Methimazole 10 mg in am and 5 mg in the evening, both with meals.  Please return for labs in 4 weeks. Please join MyChart for easier communication.  Please stop the Methimazole (Tapazole) and call us or your primary care doctor if you develop: - sore throat - fever - yellow skin - dark urine - light colored stools As we will then need to check your blood counts and liver tests.  Hyperthyroidism The thyroid is a large gland located in the lower front part of your neck. The thyroid helps control metabolism. Metabolism is how your body uses food. It controls metabolism with the hormone thyroxine. When the thyroid is overactive, it produces too much hormone. When this happens, these following problems may occur:   Nervousness  Heat intolerance  Weight loss (in spite of increase food intake)  Diarrhea  Change in hair or skin texture  Palpitations (heart skipping or having extra beats)  Tachycardia (rapid heart rate)  Loss of menstruation (amenorrhea)  Shaking of the hands CAUSES  Grave's Disease (the immune system attacks the thyroid gland). This is the most common cause.  Inflammation of the thyroid gland.  Tumor (usually benign) in the thyroid gland or elsewhere.  Excessive use of thyroid medications (both prescription and 'natural').  Excessive ingestion of Iodine. DIAGNOSIS  To prove hyperthyroidism, your caregiver may do blood tests and ultrasound tests. Sometimes the signs are hidden. It may be necessary for your caregiver to watch this illness with blood tests, either before or after diagnosis and treatment. TREATMENT Short-term treatment There are several treatments to control symptoms. Drugs called beta blockers may give some relief. Drugs that decrease hormone production will provide temporary relief in many people. These measures will usually not give permanent relief. Definitive therapy There are treatments available which can be discussed between  you and your caregiver which will permanently treat the problem. These treatments range from surgery (removal of the thyroid), to the use of radioactive iodine (destroys the thyroid by radiation), to the use of antithyroid drugs (interfere with hormone synthesis). The first two treatments are permanent and usually successful. They most often require hormone replacement therapy for life. This is because it is impossible to remove or destroy the exact amount of thyroid required to make a person euthyroid (normal). HOME CARE INSTRUCTIONS  See your caregiver if the problems you are being treated for get worse. Examples of this would be the problems listed above. SEEK MEDICAL CARE IF: Your general condition worsens. MAKE SURE YOU:   Understand these instructions.  Will watch your condition.  Will get help right away if you are not doing well or get worse. Document Released: 10/09/2005 Document Revised: 01/01/2012 Document Reviewed: 02/20/2007 [facility] Patient Information 2014 [place], [place].

## 2013-12-03 ENCOUNTER — Encounter: Payer: Self-pay | Admitting: Internal Medicine

## 2013-12-03 ENCOUNTER — Ambulatory Visit: Payer: 59 | Admitting: Internal Medicine

## 2013-12-03 ENCOUNTER — Other Ambulatory Visit (INDEPENDENT_AMBULATORY_CARE_PROVIDER_SITE_OTHER): Payer: 59

## 2013-12-03 ENCOUNTER — Ambulatory Visit (INDEPENDENT_AMBULATORY_CARE_PROVIDER_SITE_OTHER): Payer: 59 | Admitting: Internal Medicine

## 2013-12-03 VITALS — BP 136/92 | HR 81 | Temp 98.1°F | Wt 168.0 lb

## 2013-12-03 DIAGNOSIS — E0590 Thyrotoxicosis, unspecified without thyrotoxic crisis or storm: Secondary | ICD-10-CM

## 2013-12-03 DIAGNOSIS — J309 Allergic rhinitis, unspecified: Secondary | ICD-10-CM

## 2013-12-03 LAB — T4, FREE: Free T4: 0.98 ng/dL (ref 0.60–1.60)

## 2013-12-03 LAB — T3, FREE: T3, Free: 3.2 pg/mL (ref 2.3–4.2)

## 2013-12-03 LAB — TSH: TSH: 0.02 u[IU]/mL — ABNORMAL LOW (ref 0.35–5.50)

## 2013-12-03 NOTE — Patient Instructions (Addendum)
Allergic Rhinitis Allergic rhinitis is when the mucous membranes in the nose respond to allergens. Allergens are particles in the air that cause your body to have an allergic reaction. This causes you to release allergic antibodies. Through a chain of events, these eventually cause you to release histamine into the blood stream. Although meant to protect the body, it is this release of histamine that causes your discomfort, such as frequent sneezing, congestion, and an itchy, runny nose.  CAUSES  Seasonal allergic rhinitis (hay fever) is caused by pollen allergens that may come from grasses, trees, and weeds. Year-round allergic rhinitis (perennial allergic rhinitis) is caused by allergens such as house dust mites, pet dander, and mold spores.  SYMPTOMS   Nasal stuffiness (congestion).  Itchy, runny nose with sneezing and tearing of the eyes. DIAGNOSIS  Your health care provider can help you determine the allergen or allergens that trigger your symptoms. If you and your health care provider are unable to determine the allergen, skin or blood testing may be used. TREATMENT  Allergic Rhinitis does not have a cure, but it can be controlled by:  Medicines and allergy shots (immunotherapy).  Avoiding the allergen. Hay fever may often be treated with antihistamines in pill or nasal spray forms. Antihistamines block the effects of histamine. There are over-the-counter medicines that may help with nasal congestion and swelling around the eyes. Check with your health care provider before taking or giving this medicine.  If avoiding the allergen or the medicine prescribed do not work, there are many new medicines your health care provider can prescribe. Stronger medicine may be used if initial measures are ineffective. Desensitizing injections can be used if medicine and avoidance does not work. Desensitization is when a patient is given ongoing shots until the body becomes less sensitive to the allergen.  Make sure you follow up with your health care provider if problems continue. HOME CARE INSTRUCTIONS It is not possible to completely avoid allergens, but you can reduce your symptoms by taking steps to limit your exposure to them. It helps to know exactly what you are allergic to so that you can avoid your specific triggers. SEEK MEDICAL CARE IF:   You have a fever.  You develop a cough that does not stop easily (persistent).  You have shortness of breath.  You start wheezing.  Symptoms interfere with normal daily activities. Document Released: 07/04/2001 Document Revised: 07/30/2013 Document Reviewed: 06/16/2013 [employer] Patient Information 2014 [employer].  

## 2013-12-03 NOTE — Progress Notes (Signed)
HPI  Pt presents to the clinic today with c/o cough and nasal congestion. He reports this started about 5 days ago. The cough is productive of thick yellow/green sputum. He has taken Robitussin and Alka Seltzer OTC with some relief. He denies fever, chills or body aches. He does have a history of seasonal allergies. He does take Claritin and Nasonex prn for this. He has not had sick contacts. He does not smoke.  Review of Systems    Past Medical History  Diagnosis Date  . Perennial allergic rhinitis     eval by Dr. Redmond Baseman with ENT 2011  . BPH (benign prostatic hypertrophy)     on proscar and flomax (saw Dr. Terance Hart)  . Subclinical hyperthyroidism     Graves (upper normal thyroid uptake, mildly elevated TSH R Ab)    Family History  Problem Relation Age of Onset  . Hyperlipidemia Mother   . Hyperlipidemia Father   . Hypertension Father   . Cancer Paternal Grandmother 8    colon  . Colon polyps Mother   . Diabetes Neg Hx   . Stroke Maternal Grandmother   . Stroke Maternal Uncle   . CAD Paternal Grandfather     small MI  . Thyroid disease Mother     s/p iodine treatment    History   Social History  . Marital Status: Married    Spouse Name: N/A    Number of Children: 2  . Years of Education: N/A   Occupational History  . [place] History Main Topics  . Smoking status: Never Smoker   . Smokeless tobacco: Former Systems developer    Types: Snuff, Chew  . Alcohol Use: 2.0 oz/week    4 drink(s) per week     Comment: couple of times a week  . Drug Use: No  . Sexual Activity: Yes   Other Topics Concern  . Not on file   Social History Narrative   Caffeine: 3-4 cups coffee/day   Married and lives with wife and 2 sons   Occupation: Event organiser   Activity: walks several miles daily   Diet: good water, fruits/vegetables daily    No Known Allergies   Constitutional: Denies headache, fatigue, fever or abrupt weight changes.  HEENT:  Positive  nasal congestion and sore throat. Denies eye redness, ear pain, ringing in the ears, wax buildup, runny nose or bloody nose. Respiratory: Positive cough. Denies difficulty breathing or shortness of breath.  Cardiovascular: Denies chest pain, chest tightness, palpitations or swelling in the hands or feet.   No other specific complaints in a complete review of systems (except as listed in HPI above).  Objective:    General: Appears his stated age, well developed, well nourished in NAD. HEENT: Head: normal shape and size, no sinus tenderness noted; Eyes: sclera white, no icterus, conjunctiva pink, PERRLA and EOMs intact; Ears: Tm's gray and intact, normal light reflex, + effusion bilaterally; Nose: mucosa pink and moist, septum midline; Throat/Mouth: + PND. Teeth present, mucosa pink and moist, no exudate noted, no lesions or ulcerations noted.  Neck: Neck supple, trachea midline. No massses, lumps or thyromegaly present.  Cardiovascular: Normal rate and rhythm. S1,S2 noted.  No murmur, rubs or gallops noted. No JVD or BLE edema. No carotid bruits noted. Pulmonary/Chest: Normal effort and positive vesicular breath sounds. No respiratory distress. No wheezes, rales or ronchi noted.      Assessment & Plan:   Allergic Rhinitis  Can use a Neti  Pot which can be purchased from your local drug store. Take your claritin and zyrtec daily x 1 week If no improvement by Monday, will call you in Amoxil.  RTC as needed or if symptoms persist.

## 2013-12-03 NOTE — Progress Notes (Signed)
Pre-visit discussion using our clinic review tool. No additional management support is needed unless otherwise documented below in the visit note.  

## 2013-12-08 ENCOUNTER — Telehealth: Payer: Self-pay

## 2013-12-08 ENCOUNTER — Other Ambulatory Visit: Payer: Self-pay | Admitting: Internal Medicine

## 2013-12-08 MED ORDER — AMOXICILLIN-POT CLAVULANATE 875-125 MG PO TABS: 1.0000 | ORAL_TABLET | Freq: Two times a day (BID) | ORAL | Status: DC

## 2013-12-08 NOTE — Telephone Encounter (Signed)
Pt is aware of Rx being sent to pharmacy 

## 2013-12-08 NOTE — Telephone Encounter (Signed)
Pt was seen 12/03/13; pt still prod cough with yellow green phlegm;head and chest congestion; pressure feeling in head. Pt was advised if not better by Mon to cb for antibiotic to [facility].Please advise.

## 2013-12-08 NOTE — Telephone Encounter (Signed)
Will call in Augmentin

## 2013-12-22 ENCOUNTER — Other Ambulatory Visit: Payer: Self-pay | Admitting: *Deleted

## 2013-12-22 MED ORDER — METHIMAZOLE 10 MG PO TABS: ORAL_TABLET | ORAL | Status: DC

## 2014-01-15 ENCOUNTER — Ambulatory Visit: Payer: 59 | Admitting: Internal Medicine

## 2014-01-15 ENCOUNTER — Encounter: Payer: Self-pay | Admitting: Internal Medicine

## 2014-01-15 ENCOUNTER — Ambulatory Visit (INDEPENDENT_AMBULATORY_CARE_PROVIDER_SITE_OTHER): Payer: 59 | Admitting: Internal Medicine

## 2014-01-15 VITALS — BP 122/80 | HR 63 | Temp 98.0°F | Resp 12 | Wt 192.0 lb

## 2014-01-15 DIAGNOSIS — E0500 Thyrotoxicosis with diffuse goiter without thyrotoxic crisis or storm: Secondary | ICD-10-CM

## 2014-01-15 DIAGNOSIS — E0590 Thyrotoxicosis, unspecified without thyrotoxic crisis or storm: Secondary | ICD-10-CM

## 2014-01-15 LAB — T4, FREE: FREE T4: 0.42 ng/dL — AB (ref 0.60–1.60)

## 2014-01-15 LAB — T3: T3, Total: 92.9 ng/dL (ref 80.0–204.0)

## 2014-01-15 LAB — TSH: TSH: 0.42 u[IU]/mL (ref 0.35–5.50)

## 2014-01-15 MED ORDER — METHIMAZOLE 10 MG PO TABS: ORAL_TABLET | ORAL | Status: DC

## 2014-01-15 NOTE — Patient Instructions (Signed)
Please come back for a follow-up appointment in 4 months. Please stop at the lab.

## 2014-01-15 NOTE — Progress Notes (Signed)
Patient ID: William Tucker, male   DOB: September 14, 1966, 48 y.o.   MRN: [id number]   HPI  William Tucker is a 48 y.o.-year-old male, returning for f/u for Graves ds.  Pt has had a Thyroid Uptake and scan in 10/2012: Uptake 29%, at ULN, scan uniform. A thyroid U/S then showed a tiny nodule, no other defects.  I reviewed pt's thyroid tests: Lab Results  Component Value Date   TSH 0.02* 12/03/2013   TSH 0.01* 10/08/2013   TSH 0.02* 01/27/2013   TSH 0.02* 10/08/2012   TSH 0.04* 05/15/2011   TSH 0.49 03/03/2010   TSH 0.43 01/10/2008   FREET4 0.98 12/03/2013   FREET4 2.18* 10/08/2013   FREET4 1.43 01/27/2013   FREET4 1.12 10/08/2012   TR Ab 2013 : 16.9% (<16)  He is on MMI 10 mg in am and 5 mg in pm. He may forget the second dose.   Pt denies feeling nodules in neck, hoarseness, dysphagia/odynophagia, SOB with lying down; he c/o: - no excessive sweating/heat intolerance - + R>L hand tremors, drinks "too much coffee" >> 4 cups a day - no anxiety - no palpitations - no problems with concentration - no fatigue - no hyperdefecation - + weight gain: 10-12 lbs  I reviewed his chart and he also has a history of BPH - [facility], HL, psoriasis, leukopenia, vit B12 def.   ROS: Constitutional: + weight gain, no fatigue, + increased appetite, no subjective hyperthermia/hypothermia Eyes: no blurry vision, no xerophthalmia ENT: no sore throat, no nodules palpated in throat, no dysphagia/odynophagia, no hoarseness Cardiovascular: no CP/SOB/palpitations/leg swelling Respiratory: no cough/SOB Gastrointestinal: no N/V/D/C Musculoskeletal: no muscle/joint aches Skin: no rashes Neurological: + tremors/no numbness/tingling/dizziness  I reviewed pt's medications, allergies, PMH, social hx, family hx and no changes required, except as mentioned above.  PE: BP 122/80  Pulse 63  Temp(Src) 98 F (36.7 C) (Oral)  Resp 12  Wt 192 lb (87.091 kg)  SpO2 97% Wt Readings from Last 3 Encounters:  01/15/14  192 lb (87.091 kg)  12/03/13 168 lb (76.204 kg)  10/17/13 170 lb 6 oz (77.282 kg)   Constitutional: normal weight, in NAD Eyes: PERRLA, EOMI, no exophthalmos, no lid lag, no stare ENT: moist mucous membranes, no thyromegaly, no thyroid bruits, no cervical lymphadenopathy Cardiovascular: RRR, No MRG Respiratory:  - could not listen to chest - bulletproof vest Gastrointestinal: abdomen soft, NT, ND, BS+ Musculoskeletal: no deformities, strength intact in all 4 Skin: moist, warm, no rashes Neurological: + tremor with outstretched hands R>L, DTR normal in all 4  ASSESSMENT: 1. Graves Ds. - most likely, especially with FH of HTyr, long evolution, and positive TRAb  PLAN:  1. Patient with long h/o low TSH, now with overt hyperthyroidism, without many thyrotoxic sxs: increased appetite w/o weight gain, tremors >> now improved on MMI. - we again discussed about possible modalities of treatment for Graves ds to include methimazole use, radioactive iodine ablation. Will stay with [facility] for now. - check TFTs today - RTC in 4 months, but likely sooner for repeat labs  Office Visit on 01/15/2014  Component Date Value Ref Range Status  . TSH 01/15/2014 0.42  0.35 - 5.50 uIU/mL Final  . Free T4 01/15/2014 0.42* 0.60 - 1.60 ng/dL Final   Will decrease MMI to 5 mg bid >> recheck in 4-6 weeks.

## 2014-01-28 ENCOUNTER — Telehealth: Payer: Self-pay | Admitting: Family Medicine

## 2014-01-28 NOTE — Telephone Encounter (Signed)
Patient Information:  Caller Name: Merry Proud  Phone: [phone]  Patient: William Tucker, William Tucker  Gender: Male  DOB: [birth year]  Age: 48 Years  PCP: Ria Bush [facility])  Office Follow Up:  Does the office need to follow up with this patient?: No  Instructions For The Office: N/A  RN Note:  Pt not able to schedule an appointment for today.  Symptoms  Reason For Call & Symptoms: Pt misses a couple of doses of Proscar and onset 01/27/2014 of blood in urine and frequent urination.  No blood in urine today.  Reviewed Health History In EMR: Yes  Reviewed Medications In EMR: Yes  Reviewed Allergies In EMR: Yes  Reviewed Surgeries / Procedures: Yes  Date of Onset of Symptoms: 01/27/2014  Treatments Tried: Ibuprofen 800 mg at hs  Treatments Tried Worked: Yes  Guideline(s) Used:  Urination Pain - Male  Disposition Per Guideline:   See Today in Office  Reason For Disposition Reached:   All other males with painful urination, or patient wants to be seen  Advice Given:  Fluids  : Drink extra fluids (Reason: to produce a dilute, nonirritating urine).  Call Back If:  You become worse.  Patient Will Follow Care Advice:  YES  Appointment Scheduled:  01/29/2014 11:45:00 Appointment Scheduled Provider:  Elsie Stain Brigitte Pulse) [facility])

## 2014-01-29 ENCOUNTER — Ambulatory Visit (INDEPENDENT_AMBULATORY_CARE_PROVIDER_SITE_OTHER): Payer: 59 | Admitting: Family Medicine

## 2014-01-29 ENCOUNTER — Encounter: Payer: Self-pay | Admitting: Family Medicine

## 2014-01-29 VITALS — BP 110/82 | HR 73 | Temp 98.0°F | Wt 173.0 lb

## 2014-01-29 DIAGNOSIS — N390 Urinary tract infection, site not specified: Secondary | ICD-10-CM

## 2014-01-29 DIAGNOSIS — R300 Dysuria: Secondary | ICD-10-CM

## 2014-01-29 LAB — URINALYSIS, MICROSCOPIC ONLY

## 2014-01-29 LAB — POCT URINALYSIS DIPSTICK
Bilirubin, UA: NEGATIVE
Glucose, UA: NEGATIVE
KETONES UA: NEGATIVE
Nitrite, UA: NEGATIVE
PH UA: 7
PROTEIN UA: NEGATIVE
SPEC GRAV UA: 1.01
Urobilinogen, UA: NEGATIVE

## 2014-01-29 MED ORDER — CIPROFLOXACIN HCL 500 MG PO TABS: 500.0000 mg | ORAL_TABLET | Freq: Two times a day (BID) | ORAL | Status: DC

## 2014-01-29 NOTE — Patient Instructions (Signed)
Drink plenty of water and start the antibiotics today.  We'll contact you with your lab report.  Take care.   If you don't improve or if you have other troubles, then let us know.

## 2014-01-29 NOTE — Progress Notes (Signed)
Pre visit review using our clinic review tool, if applicable. No additional management support is needed unless otherwise documented below in the visit note.  He had been using a pill box, had been loading it on the weekend. He didn't know if he had the recent sx from skipping the proscar.  He may have gone a few days w/o the proscar accidentally.  Tuesday he has some painful terminal urination.  He started back on the medicine in the meantime.  He saw some gross blood in urine one day.  He saw a likely clot in his urine.  No h/o renal stones, no typical severe back pain.  He did have some milder lower back pain.  No fevers.  Still with terminal dysuria today.  No testicle pain.  No vomiting, no diarrhea.  No h/o UTIs.  Monogamous.  No h/o STDs.  He is some better today than prev.  His bladder felt full, some pressure in the suprapubic area.  He had some urinary frequency.  H/o BPH at baseline.    Meds, vitals, and allergies reviewed.   ROS: See HPI.  Otherwise, noncontributory.  nad ncat Mmm rrr ctab abd soft, not ttp except for suprapubic area slightly ttp No CVA pain Ext w/o edema

## 2014-01-29 NOTE — Telephone Encounter (Signed)
Noted.  Pleasant patient.

## 2014-01-30 ENCOUNTER — Ambulatory Visit: Payer: 59 | Admitting: Family Medicine

## 2014-01-30 DIAGNOSIS — N390 Urinary tract infection, site not specified: Secondary | ICD-10-CM | POA: Insufficient documentation

## 2014-01-30 NOTE — Assessment & Plan Note (Signed)
Presumed cystitis, start cipro, check ucx, fu prn.  Nontoxic. D/w pt. Routed to PCP as FYI.

## 2014-01-31 LAB — URINE CULTURE

## 2014-02-02 ENCOUNTER — Other Ambulatory Visit: Payer: Self-pay | Admitting: Family Medicine

## 2014-03-27 ENCOUNTER — Other Ambulatory Visit: Payer: Self-pay | Admitting: Family Medicine

## 2014-05-18 ENCOUNTER — Encounter: Payer: Self-pay | Admitting: Internal Medicine

## 2014-05-18 ENCOUNTER — Ambulatory Visit (INDEPENDENT_AMBULATORY_CARE_PROVIDER_SITE_OTHER): Payer: 59 | Admitting: Internal Medicine

## 2014-05-18 VITALS — BP 118/82 | HR 73 | Temp 98.1°F | Resp 12 | Wt 176.0 lb

## 2014-05-18 DIAGNOSIS — E0500 Thyrotoxicosis with diffuse goiter without thyrotoxic crisis or storm: Secondary | ICD-10-CM

## 2014-05-18 DIAGNOSIS — R252 Cramp and spasm: Secondary | ICD-10-CM

## 2014-05-18 LAB — T3, FREE: T3, Free: 2.6 pg/mL (ref 2.3–4.2)

## 2014-05-18 LAB — T4, FREE: Free T4: 0.8 ng/dL (ref 0.60–1.60)

## 2014-05-18 LAB — TSH: TSH: 0.86 u[IU]/mL (ref 0.35–4.50)

## 2014-05-18 MED ORDER — METHIMAZOLE 10 MG PO TABS: ORAL_TABLET | ORAL | Status: DC

## 2014-05-18 NOTE — Progress Notes (Signed)
Patient ID: William Tucker, male   DOB: Jun 03, 1966, 48 y.o.   MRN: [id number]   HPI  William Tucker is a 48 y.o.-year-old male, returning for f/u for Graves ds. Last visit 4 mo ago.  Pt has had a Thyroid Uptake and scan in 10/2012: Uptake 29%, at ULN, scan uniform, c/w Graves ds. A thyroid U/S then showed a tiny nodule, no other defects.  I reviewed pt's thyroid tests: Lab Results  Component Value Date   TSH 0.42 01/15/2014   TSH 0.02* 12/03/2013   TSH 0.01* 10/08/2013   TSH 0.02* 01/27/2013   TSH 0.02* 10/08/2012   TSH 0.04* 05/15/2011   TSH 0.49 03/03/2010   TSH 0.43 01/10/2008   FREET4 0.42* 01/15/2014   FREET4 0.98 12/03/2013   FREET4 2.18* 10/08/2013   FREET4 1.43 01/27/2013   FREET4 1.12 10/08/2012   TR Ab 2013 : 16.9% (<16)  He was on MMI 10 mg in am and 5 mg in pm. At last visit, we decreased the dose to 5 mg bid. He did not return in 1 mo for recheck.  Pt denies feeling nodules in neck, hoarseness, dysphagia/odynophagia, SOB with lying down; he c/o: - no excessive sweating/heat intolerance - no more hand tremors (he had them before - "too much coffee" >> 4 cups a day) - no anxiety - no palpitations - no problems with concentration - no fatigue - no hyperdefecation - no weight gain since last visit He c/o mm cramps.  I reviewed his chart and he also has a history of BPH - [facility], HL, psoriasis, leukopenia, vit B12 def.   ROS: Constitutional: no weight gain, no fatigue, no subjective hyperthermia/hypothermia Eyes: no blurry vision, no xerophthalmia ENT: no sore throat, no nodules palpated in throat, no dysphagia/odynophagia, no hoarseness Cardiovascular: no CP/SOB/palpitations/leg swelling Respiratory: no cough/SOB Gastrointestinal: no N/V/D/C Musculoskeletal: no muscle/joint aches, + mm cramps Skin: no rashes Neurological: no more tremors/no numbness/tingling/dizziness  I reviewed pt's medications, allergies, PMH, social hx, family hx and no changes required,  except as mentioned above.  PE: BP 118/82  Pulse 73  Temp(Src) 98.1 F (36.7 C) (Oral)  Resp 12  Wt 176 lb (79.833 kg)  SpO2 96% Wt Readings from Last 3 Encounters:  05/18/14 176 lb (79.833 kg)  01/29/14 173 lb (78.472 kg)  01/15/14 192 lb (87.091 kg)   Constitutional: normal weight, in NAD Eyes: PERRLA, EOMI, no exophthalmos, no lid lag, no stare ENT: moist mucous membranes, no thyromegaly, no thyroid bruits, no cervical lymphadenopathy Cardiovascular: RRR, No MRG Respiratory:  - could not listen to chest - bulletproof vest Gastrointestinal: abdomen soft, NT, ND, BS+ Musculoskeletal: no deformities, strength intact Skin: moist, warm, no rashes Neurological: no tremor with outstretched hands, DTR normal in all 4  ASSESSMENT: 1. Graves Ds.  2. Leg cramps  PLAN:  1. Patient with long h/o suppressed TSH, without many thyrotoxic sxs: increased appetite w/o weight gain, tremors >> now all improved on MMI. - we again discussed about possible modalities of treatment for Graves ds to include methimazole use, radioactive iodine ablation. Will stay with [facility] for now. - check TFTs today - RTC in 6 months, but likely sooner for repeat labs  2. Leg cramps - advised to stay better hydrated  Office Visit on 05/18/2014  Component Date Value Ref Range Status  . T3, Free 05/18/2014 2.6  2.3 - 4.2 pg/mL Final  . Free T4 05/18/2014 0.80  0.60 - 1.60 ng/dL Final  . TSH 05/18/2014 0.86  0.35 - 4.50 uIU/mL Final   TFTs normal >> decrease MMI to 5 mg daily in am. Repeat labs in 6 weeks.

## 2014-05-18 NOTE — Patient Instructions (Signed)
Please stop at the lab. Please come back for a follow-up appointment in 6 months, but we will most likely need to check labs before then.

## 2014-06-16 ENCOUNTER — Other Ambulatory Visit: Payer: Self-pay | Admitting: *Deleted

## 2014-06-16 MED ORDER — METHIMAZOLE 5 MG PO TABS: 5.0000 mg | ORAL_TABLET | Freq: Every day | ORAL | Status: DC

## 2014-07-06 ENCOUNTER — Other Ambulatory Visit (INDEPENDENT_AMBULATORY_CARE_PROVIDER_SITE_OTHER): Payer: 59

## 2014-07-06 ENCOUNTER — Other Ambulatory Visit: Payer: Self-pay | Admitting: Internal Medicine

## 2014-07-06 DIAGNOSIS — E0500 Thyrotoxicosis with diffuse goiter without thyrotoxic crisis or storm: Secondary | ICD-10-CM

## 2014-07-06 LAB — T3, FREE: T3, Free: 3 pg/mL (ref 2.3–4.2)

## 2014-07-06 LAB — T4, FREE: Free T4: 0.82 ng/dL (ref 0.60–1.60)

## 2014-07-06 LAB — TSH: TSH: 0.38 u[IU]/mL (ref 0.35–4.50)

## 2014-09-13 ENCOUNTER — Ambulatory Visit (INDEPENDENT_AMBULATORY_CARE_PROVIDER_SITE_OTHER): Payer: 59 | Admitting: Family Medicine

## 2014-09-13 VITALS — BP 142/96 | HR 78 | Temp 97.9°F | Resp 18 | Ht 68.0 in | Wt 179.2 lb

## 2014-09-13 DIAGNOSIS — H65193 Other acute nonsuppurative otitis media, bilateral: Secondary | ICD-10-CM

## 2014-09-13 MED ORDER — HYDROCODONE-ACETAMINOPHEN 5-325 MG PO TABS: 1.0000 | ORAL_TABLET | Freq: Four times a day (QID) | ORAL | Status: DC | PRN

## 2014-09-13 MED ORDER — AMOXICILLIN 875 MG PO TABS: 875.0000 mg | ORAL_TABLET | Freq: Two times a day (BID) | ORAL | Status: DC

## 2014-09-13 NOTE — Progress Notes (Signed)
° °  Subjective:    Patient ID: William Tucker, male    DOB: 1966/03/01, 48 y.o.   MRN: [id number] This chart was scribed for William Haber, MD by Marti Sleigh, Medical Scribe. This patient was seen in Room  and the patient's care was started a 9:31 AM.  Chief Complaint  Patient presents with   Ear Pain    feel full, can not hear well    HPI  HPI Comments: William Tucker is a 48 y.o. male with a past hx of HLD and graves disease who presents to [facility] complaining of bilateral ear pain that started three days ago. Pt endorses associated sleep disturbance, hearing loss, sinus congestion, rhinorrhea and mildly productive cough. Pt denies sore throat. Pt states that the pain started in the left ear three days ago, and yesterday moved to his right ear. Pt sees Dr. Artemio Aly for his thyroid. Pt states he believes he was exposed to mold. Pt states he is allergic to mold.  Pt is a Engineer, structural.   Review of Systems  HENT: Positive for congestion, ear pain, hearing loss and rhinorrhea.   Respiratory: Positive for cough.        Objective:   Physical Exam  Constitutional: He is oriented to person, place, and time. He appears well-developed and well-nourished.  HENT:  Head: Normocephalic and atraumatic.  Bilateraly red TMs with hemmorhage on left TM, retraction bilaterally.  Eyes: Pupils are equal, round, and reactive to light.  Neck: Neck supple.  Cardiovascular: Normal rate and regular rhythm.   Pulmonary/Chest: Effort normal and breath sounds normal. No respiratory distress.  Rhonchi with inspiration and expiration.  Neurological: He is alert and oriented to person, place, and time.  Skin: Skin is warm and dry.  Psychiatric: He has a normal mood and affect. His behavior is normal.  Nursing note and vitals reviewed.      Assessment & Plan:    This chart was scribed in my presence and reviewed by me personally. Acute nonsuppurative otitis media of both ears - Plan: amoxicillin  (AMOXIL) 875 MG tablet, HYDROcodone-acetaminophen (NORCO) 5-325 MG per tablet  Signed, William Haber, MD

## 2014-09-13 NOTE — Patient Instructions (Signed)

## 2014-09-17 ENCOUNTER — Telehealth: Payer: Self-pay

## 2014-09-17 NOTE — Telephone Encounter (Signed)
Pt called in wanting someone to call him in regards to his ear infection. He stated he came in and saw Dr. Carlean Jews on 09/13/14 and the meds he gave him are working but [place] and wanted to know if he should or if it will affected his ears in anyway. States he is still having trouble hearing out of both ears but it is getting better.   He would liked to be called on [phone].

## 2014-09-18 NOTE — Telephone Encounter (Signed)
Called advised to use plain mucinex/ try OTC nasal sprays/ to keep mucus from collecting in ears/ and to keep mucus thin. FYI to you.

## 2014-10-03 ENCOUNTER — Telehealth: Payer: Self-pay

## 2014-10-03 NOTE — Telephone Encounter (Signed)
Patient is requesting another round of antibiotic.  He was treated for an ear infection, now it is in his chest.  Informed patient he would need to come in for OV.  Stated if he is not better he will come in tomorrow.  [phone]

## 2014-11-19 ENCOUNTER — Ambulatory Visit (INDEPENDENT_AMBULATORY_CARE_PROVIDER_SITE_OTHER): Payer: 59 | Admitting: Internal Medicine

## 2014-11-19 ENCOUNTER — Encounter: Payer: Self-pay | Admitting: Internal Medicine

## 2014-11-19 VITALS — BP 118/64 | HR 71 | Temp 97.9°F | Resp 12 | Wt 168.0 lb

## 2014-11-19 DIAGNOSIS — E0500 Thyrotoxicosis with diffuse goiter without thyrotoxic crisis or storm: Secondary | ICD-10-CM

## 2014-11-19 LAB — T3, FREE: T3 FREE: 3.1 pg/mL (ref 2.3–4.2)

## 2014-11-19 LAB — T4, FREE: Free T4: 0.74 ng/dL (ref 0.60–1.60)

## 2014-11-19 LAB — TSH: TSH: 1.68 u[IU]/mL (ref 0.35–4.50)

## 2014-11-19 MED ORDER — METHIMAZOLE 5 MG PO TABS: 2.5000 mg | ORAL_TABLET | Freq: Every day | ORAL | Status: DC

## 2014-11-19 NOTE — Progress Notes (Signed)
Patient ID: William Tucker, male   DOB: 07/08/1966, 49 y.o.   MRN: [id number]   HPI  William Tucker is a 49 y.o.-year-old male, returning for f/u for Graves ds. Last visit 6 mo ago.  Pt has had a Thyroid Uptake and scan in 10/2012: Uptake 29%, at ULN, scan uniform, c/w Graves ds.  A thyroid U/S then showed a tiny nodule, no other defects.  He was initially on MMI 10 mg in am and 5 mg in pm >> decreased the dose to 5 mg bid >> at last visit decreased to 5 mg daily. A thyroid test panel obtained 1.5 months after the change was normal, with a TSH of 0.38, on the lower side of normal. Because of this, we continued the 5 mg daily of MMI. He is currently on this dose.  I reviewed pt's thyroid tests: Lab Results  Component Value Date   TSH 0.38 07/06/2014   TSH 0.86 05/18/2014   TSH 0.42 01/15/2014   TSH 0.02* 12/03/2013   TSH 0.01* 10/08/2013   TSH 0.02* 01/27/2013   TSH 0.02* 10/08/2012   TSH 0.04* 05/15/2011   TSH 0.49 03/03/2010   TSH 0.43 01/10/2008   FREET4 0.82 07/06/2014   FREET4 0.80 05/18/2014   FREET4 0.42* 01/15/2014   FREET4 0.98 12/03/2013   FREET4 2.18* 10/08/2013   FREET4 1.43 01/27/2013   FREET4 1.12 10/08/2012  TR Ab 2013 : 16.9% (<16)  Pt denies feeling nodules in neck, hoarseness, dysphagia/odynophagia, SOB with lying down.  He reports: - + weight gain over the Holidays - no excessive sweating/heat intolerance - no more hand tremors (he had them before - "too much coffee" >> 4 cups a day) - no anxiety - no palpitations - no problems with concentration - no fatigue - no hyperdefecation - no weight gain since last visit  I reviewed his chart and he also has a history of BPH - [facility], HL, psoriasis, leukopenia, vit B12 def.   ROS: Constitutional: no weight gain, no fatigue, no subjective hyperthermia/hypothermia Eyes: no blurry vision, no xerophthalmia ENT: no sore throat, no nodules palpated in throat, no dysphagia/odynophagia, no  hoarseness Cardiovascular: no CP/SOB/palpitations/leg swelling Respiratory: no cough/SOB Gastrointestinal: no N/V/D/C Musculoskeletal: no muscle/joint aches Skin: no rashes Neurological: no more tremors/no numbness/tingling/dizziness  I reviewed pt's medications, allergies, PMH, social hx, family hx, and changes were documented in the history of present illness. Otherwise, unchanged from my initial visit note.   PE: BP 118/64 mmHg  Pulse 71  Temp(Src) 97.9 F (36.6 C) (Oral)  Resp 12  Wt 168 lb (76.204 kg)  SpO2 94% Body mass index is 25.55 kg/(m^2).  Wt Readings from Last 3 Encounters:  11/19/14 176 lb (76.204 kg)  09/13/14 179 lb 3.2 oz (81.285 kg)  05/18/14 176 lb (79.833 kg)   Constitutional: normal weight, in NAD Eyes: PERRLA, EOMI, no exophthalmos, no lid lag, no stare ENT: moist mucous membranes, no thyromegaly, no thyroid bruits, no cervical lymphadenopathy Cardiovascular: RRR, No MRG Respiratory:  - could not listen to chest - bulletproof vest Gastrointestinal: abdomen soft, NT, ND, BS+ Musculoskeletal: no deformities, strength intact Skin: moist, warm, no rashes Neurological: no tremor with outstretched hands, DTR normal in all 4  ASSESSMENT: 1. Graves Ds.  PLAN:  1. Patient with long h/o suppressed TSH, without many thyrotoxic sxs: increased appetite w/o weight gain, tremors >> now all improved on MMI. We were able to decrease his methimazole dose to 5 mg daily. He continues on this  dose today. He does not have symptoms of hyperthyroidism or hypothyroidism. - we again again discussed about possible modalities of treatment for Graves ds to include methimazole use, radioactive iodine ablation. Will stay with [place] for now, especially since we were able to titrate the dose down. - will check TFTs today - RTC in 6 months, but likely sooner for repeat labs  Office Visit on 11/19/2014  Component Date Value Ref Range Status  . T3, Free 11/19/2014 3.1  2.3 - 4.2 pg/mL  Final  . Free T4 11/19/2014 0.74  0.60 - 1.60 ng/dL Final  . TSH 11/19/2014 1.68  0.35 - 4.50 uIU/mL Final   TFTs normal >> will decrease the MMI to 2.5 mg daily x 6 weeks, and then recheck labs. If normal >> will stop MMI.

## 2014-11-19 NOTE — Patient Instructions (Signed)
Please stop at the lab. Please come back for a follow-up appointment in 6 months, however, would probably need to repeat the thyroid labs sooner. I will let you know.

## 2014-12-30 ENCOUNTER — Other Ambulatory Visit (INDEPENDENT_AMBULATORY_CARE_PROVIDER_SITE_OTHER): Payer: 59

## 2014-12-30 DIAGNOSIS — E0500 Thyrotoxicosis with diffuse goiter without thyrotoxic crisis or storm: Secondary | ICD-10-CM | POA: Diagnosis not present

## 2014-12-30 LAB — TSH: TSH: 0.92 u[IU]/mL (ref 0.35–4.50)

## 2014-12-30 LAB — T3, FREE: T3, Free: 3.2 pg/mL (ref 2.3–4.2)

## 2014-12-30 LAB — T4, FREE: FREE T4: 0.86 ng/dL (ref 0.60–1.60)

## 2014-12-31 ENCOUNTER — Other Ambulatory Visit: Payer: Self-pay | Admitting: Internal Medicine

## 2014-12-31 DIAGNOSIS — E0500 Thyrotoxicosis with diffuse goiter without thyrotoxic crisis or storm: Secondary | ICD-10-CM

## 2015-01-01 ENCOUNTER — Other Ambulatory Visit: Payer: 59

## 2015-02-17 ENCOUNTER — Other Ambulatory Visit: Payer: Self-pay | Admitting: Family Medicine

## 2015-05-20 ENCOUNTER — Ambulatory Visit (INDEPENDENT_AMBULATORY_CARE_PROVIDER_SITE_OTHER): Payer: 59 | Admitting: Internal Medicine

## 2015-05-20 ENCOUNTER — Encounter: Payer: Self-pay | Admitting: Internal Medicine

## 2015-05-20 VITALS — BP 124/82 | HR 61 | Temp 97.6°F | Ht 68.0 in | Wt 198.0 lb

## 2015-05-20 DIAGNOSIS — E0500 Thyrotoxicosis with diffuse goiter without thyrotoxic crisis or storm: Secondary | ICD-10-CM

## 2015-05-20 LAB — TSH: TSH: 0.09 u[IU]/mL — AB (ref 0.35–4.50)

## 2015-05-20 LAB — T3, FREE: T3 FREE: 3.1 pg/mL (ref 2.3–4.2)

## 2015-05-20 LAB — T4, FREE: Free T4: 0.9 ng/dL (ref 0.60–1.60)

## 2015-05-20 MED ORDER — METHIMAZOLE 5 MG PO TABS: 5.0000 mg | ORAL_TABLET | Freq: Every day | ORAL | Status: DC

## 2015-05-20 NOTE — Patient Instructions (Signed)
Please stop at the lab.  Please return in 1 year.  

## 2015-05-20 NOTE — Progress Notes (Signed)
Patient ID: [name], male   DOB: [birth year], 49 y.o.   MRN: [id number]   HPI  William Tucker is a 49 y.o.-year-old male, returning for f/u for Graves ds. Last visit 6 mo ago.  Pt has had a Thyroid Uptake and scan in 10/2012: Uptake 29%, at ULN, scan uniform, c/w Graves ds.  A thyroid U/S then showed a tiny nodule, no other defects.  He was initially on MMI 10 mg in am and 5 mg in pm >> decreased the dose to 5 mg bid >> then decreased to 5 mg daily >> 2.5 mg daily >> stopped MMI at last visit >> TFTs remained normal 2 mo later.  I reviewed pt's thyroid tests: Lab Results  Component Value Date   TSH 0.92 12/30/2014   TSH 1.68 11/19/2014   TSH 0.38 07/06/2014   TSH 0.86 05/18/2014   TSH 0.42 01/15/2014   TSH 0.02* 12/03/2013   TSH 0.01* 10/08/2013   TSH 0.02* 01/27/2013   TSH 0.02* 10/08/2012   TSH 0.04* 05/15/2011   FREET4 0.86 12/30/2014   FREET4 0.74 11/19/2014   FREET4 0.82 07/06/2014   FREET4 0.80 05/18/2014   FREET4 0.42* 01/15/2014   FREET4 0.98 12/03/2013   FREET4 2.18* 10/08/2013   FREET4 1.43 01/27/2013   FREET4 1.12 10/08/2012  TR Ab 2013 : 16.9% (<16)  Pt denies feeling nodules in neck, hoarseness, dysphagia/odynophagia, SOB with lying down.  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   ROS: Constitutional: see HPI Eyes: no blurry vision, no xerophthalmia ENT: no sore throat, no nodules palpated in throat, no dysphagia/odynophagia, no hoarseness Cardiovascular: no CP/SOB/palpitations/leg swelling Respiratory: no  cough/SOB Gastrointestinal: no N/V/D/C Musculoskeletal: no muscle/joint aches Skin: no rashes Neurological: no more tremors/no numbness/tingling/dizziness  I reviewed pt's medications, allergies, PMH, social hx, family hx, and changes were documented in the history of present illness. Otherwise, unchanged from my initial visit note.   PE: BP 124/82 mmHg  Pulse 61  Temp(Src) 97.6 F (36.4 C) (Oral)  Ht 5\' 8"  (1.727 m)  Wt 198 lb (89.812 kg)  BMI 30.11 kg/m2  SpO2 96% Body mass index is 30.11 kg/(m^2). Wt Readings from Last 3 Encounters:  05/20/15 198 lb (89.812 kg)  11/19/14 168 lb (76.204 kg)  09/13/14 179 lb 3.2 oz (81.285 kg)   Constitutional: normal weight, in NAD Eyes: PERRLA, EOMI, no exophthalmos, no lid lag, no stare ENT: moist mucous membranes, no thyromegaly, no thyroid bruits, no cervical lymphadenopathy Cardiovascular: RRR, No MRG Respiratory:  - could not listen to chest - bulletproof vest Gastrointestinal: abdomen soft, NT, ND, BS+ Musculoskeletal: no deformities, strength intact Skin: moist, warm, no rashes Neurological: no tremor with outstretched hands, DTR normal in all 4  ASSESSMENT: 1. Graves Ds.  PLAN:  1. Patient with long h/o suppressed TSH, without thyrotoxic sxs. He is feeling a little more fatigued lately and gained 5 lbs. We were able to decrease his methimazole dose to 5, then 2.5 mg daily and  then we stopped it in 10/2014. TFTs 2 mo later were normal. - will check TFTs today - if TFTs become abnormal again >> will need to restart a low dose MMI and stay on the dose a little longer  - RTC in 1 year, but possibly sooner for repeat labs  Office Visit on 05/20/2015  Component Date Value Ref Range Status  . TSH 05/20/2015 0.09* 0.35 - 4.50 uIU/mL Final  . Free T4 05/20/2015 0.90  0.60 - 1.60 ng/dL Final  . T3, Free 05/20/2015 3.1  2.3 - 4.2 pg/mL Final   TSH is decreased again. We will restart methimazole 5 mg daily and recheck the thyroid tests  in 6-8 weeks.

## 2015-05-28 ENCOUNTER — Other Ambulatory Visit: Payer: Self-pay | Admitting: Family Medicine

## 2015-07-01 ENCOUNTER — Other Ambulatory Visit (INDEPENDENT_AMBULATORY_CARE_PROVIDER_SITE_OTHER): Payer: 59

## 2015-07-01 ENCOUNTER — Other Ambulatory Visit: Payer: Self-pay | Admitting: Internal Medicine

## 2015-07-01 DIAGNOSIS — E0500 Thyrotoxicosis with diffuse goiter without thyrotoxic crisis or storm: Secondary | ICD-10-CM | POA: Diagnosis not present

## 2015-07-01 LAB — T3, FREE: T3 FREE: 3.2 pg/mL (ref 2.3–4.2)

## 2015-07-01 LAB — T4, FREE: Free T4: 0.81 ng/dL (ref 0.60–1.60)

## 2015-07-01 LAB — TSH: TSH: 0.48 u[IU]/mL (ref 0.35–4.50)

## 2015-07-21 ENCOUNTER — Other Ambulatory Visit: Payer: Self-pay | Admitting: Family Medicine

## 2015-08-18 ENCOUNTER — Encounter: Payer: Self-pay | Admitting: Family Medicine

## 2015-08-18 ENCOUNTER — Ambulatory Visit (INDEPENDENT_AMBULATORY_CARE_PROVIDER_SITE_OTHER): Payer: 59 | Admitting: Family Medicine

## 2015-08-18 VITALS — BP 122/86 | HR 60 | Temp 97.8°F | Ht 67.5 in | Wt 181.0 lb

## 2015-08-18 DIAGNOSIS — Z0000 Encounter for general adult medical examination without abnormal findings: Secondary | ICD-10-CM | POA: Diagnosis not present

## 2015-08-18 DIAGNOSIS — E538 Deficiency of other specified B group vitamins: Secondary | ICD-10-CM | POA: Diagnosis not present

## 2015-08-18 DIAGNOSIS — E785 Hyperlipidemia, unspecified: Secondary | ICD-10-CM

## 2015-08-18 DIAGNOSIS — E0500 Thyrotoxicosis with diffuse goiter without thyrotoxic crisis or storm: Secondary | ICD-10-CM | POA: Diagnosis not present

## 2015-08-18 DIAGNOSIS — N401 Enlarged prostate with lower urinary tract symptoms: Secondary | ICD-10-CM

## 2015-08-18 DIAGNOSIS — N138 Other obstructive and reflux uropathy: Secondary | ICD-10-CM

## 2015-08-18 LAB — LIPID PANEL
Cholesterol: 184 mg/dL (ref 0–200)
HDL: 49.3 mg/dL (ref >39.00)
LDL Cholesterol: 123 mg/dL — ABNORMAL HIGH (ref 0–99)
NONHDL: 134.45
Total CHOL/HDL Ratio: 4
Triglycerides: 59 mg/dL (ref 0.0–149.0)
VLDL: 11.8 mg/dL (ref 0.0–40.0)

## 2015-08-18 LAB — COMPREHENSIVE METABOLIC PANEL
ALK PHOS: 52 U/L (ref 39–117)
ALT: 16 U/L (ref 0–53)
AST: 15 U/L (ref 0–37)
Albumin: 4.6 g/dL (ref 3.5–5.2)
BILIRUBIN TOTAL: 0.7 mg/dL (ref 0.2–1.2)
BUN: 12 mg/dL (ref 6–23)
CO2: 30 mEq/L (ref 19–32)
CREATININE: 1.18 mg/dL (ref 0.40–1.50)
Calcium: 9.9 mg/dL (ref 8.4–10.5)
Chloride: 103 mEq/L (ref 96–112)
GFR: 70.13 mL/min (ref >60.00)
GLUCOSE: 99 mg/dL (ref 70–99)
Potassium: 4.3 mEq/L (ref 3.5–5.1)
SODIUM: 141 meq/L (ref 135–145)
TOTAL PROTEIN: 7 g/dL (ref 6.0–8.3)

## 2015-08-18 LAB — VITAMIN B12: Vitamin B-12: 533 pg/mL (ref 211–911)

## 2015-08-18 LAB — PSA: PSA: 0.21 ng/mL (ref 0.10–4.00)

## 2015-08-18 LAB — TSH: TSH: 0.8 u[IU]/mL (ref 0.35–4.50)

## 2015-08-18 LAB — T4, FREE: Free T4: 0.88 ng/dL (ref 0.60–1.60)

## 2015-08-18 LAB — T3: T3, Total: 117.3 ng/dL (ref 80.0–204.0)

## 2015-08-18 MED ORDER — FINASTERIDE 5 MG PO TABS: 5.0000 mg | ORAL_TABLET | Freq: Every day | ORAL | Status: DC

## 2015-08-18 MED ORDER — MOMETASONE FUROATE 50 MCG/ACT NA SUSP: 2.0000 | Freq: Every day | NASAL | Status: DC | PRN

## 2015-08-18 NOTE — Progress Notes (Signed)
Pre visit review using our clinic review tool, if applicable. No additional management support is needed unless otherwise documented below in the visit note. 

## 2015-08-18 NOTE — Assessment & Plan Note (Signed)
Continues oral b12 daily Lab Results  Component Value Date   VITAMINB12 171* 10/08/2013

## 2015-08-18 NOTE — Assessment & Plan Note (Signed)
Check FLP today. 

## 2015-08-18 NOTE — Assessment & Plan Note (Signed)
Back on low dose methimazole. Requests TFTs today. Followed by Dr Cruzita Lederer.

## 2015-08-18 NOTE — Assessment & Plan Note (Addendum)
Chronic, stable. Continue proscar and flomax. DRE stable today. As he's not had sxs with stopping proscar suggested restart both x1 mo then trial off flomax. Pt agrees.

## 2015-08-18 NOTE — Assessment & Plan Note (Signed)
Preventative protocols reviewed and updated unless pt declined. Discussed healthy diet and lifestyle.  

## 2015-08-18 NOTE — Progress Notes (Addendum)
BP 122/86 mmHg  Pulse 60  Temp(Src) 97.8 F (36.6 C) (Oral)  Ht 5' 7.5" (1.715 m)  Wt 181 lb (82.101 kg)  BMI 27.91 kg/m2   CC: CPE  Subjective:    Patient ID: William Tucker, male    DOB: Apr 24, 1966, 49 y.o.   MRN: [id number]  HPI: BURNS William Tucker is a 49 y.o. male presenting on 08/18/2015 for Annual Exam   Graves - followed by endo Dr Cruzita Lederer. On low dose MMI. Feels it's working as he has noted weight gain. Has had to buy larger clothes.   BPH - on flomax and proscar, prior saw Dr Terance Hart. Ran out of proscar for 3 weeks. Denies BOO sxs. Requests proscar refill. DRE here.   Preventative: Prostate - gets checked yearly 2/2 alpha blocker and 5a reductase inhibitor. Will check today.  Colon - had colonoscopy 2009 WNL for fmhx, rpt at age 74yo. Flu shot - declines  Tetanus - 2011  Seat belt use discussed Sunscreen use discussed. No changing moles on skin. Sees dermatologist regularly.  Caffeine: 3-4 cups coffee/day Married and lives with wife and 2 sons Occupation: Event organiser Activity: walks several miles daily Diet: good water, fruits/vegetables daily  Relevant past medical, surgical, family and social history reviewed and updated as indicated. Interim medical history since our last visit reviewed. Allergies and medications reviewed and updated. Current Outpatient Prescriptions on File Prior to Visit  Medication Sig  . aspirin 81 MG tablet Take 81 mg by mouth every other day.   . cyanocobalamin 1000 MCG tablet Take 1,000 mcg by mouth daily.  Marland Kitchen loratadine (CLARITIN) 10 MG tablet Take 10 mg by mouth daily.    . methimazole (TAPAZOLE) 5 MG tablet Take 1 tablet (5 mg total) by mouth daily.  . Multiple Vitamin (MULTIVITAMIN) tablet Take 1 tablet by mouth daily.    . tamsulosin (FLOMAX) 0.4 MG CAPS capsule Take 1 capsule by mouth  once daily   No current facility-administered medications on file prior to visit.    Review of Systems  Constitutional: Negative for  fever, chills, activity change, appetite change, fatigue and unexpected weight change.  HENT: Negative for hearing loss.   Eyes: Negative for visual disturbance.  Respiratory: Negative for cough, chest tightness, shortness of breath and wheezing.   Cardiovascular: Negative for chest pain, palpitations and leg swelling.  Gastrointestinal: Negative for nausea, vomiting, abdominal pain, diarrhea, constipation, blood in stool and abdominal distention.  Genitourinary: Negative for hematuria and difficulty urinating.  Musculoskeletal: Negative for myalgias, arthralgias and neck pain.  Skin: Negative for rash.  Neurological: Negative for dizziness, seizures, syncope and headaches.  Hematological: Negative for adenopathy. Does not bruise/bleed easily.  Psychiatric/Behavioral: Negative for dysphoric mood. The patient is not nervous/anxious.    Per HPI unless specifically indicated in ROS section     Objective:    BP 122/86 mmHg  Pulse 60  Temp(Src) 97.8 F (36.6 C) (Oral)  Ht 5' 7.5" (1.715 m)  Wt 181 lb (82.101 kg)  BMI 27.91 kg/m2  Wt Readings from Last 3 Encounters:  08/18/15 181 lb (82.101 kg)  05/20/15 198 lb (89.812 kg)  11/19/14 168 lb (76.204 kg)    Physical Exam  Constitutional: He is oriented to person, place, and time. He appears well-developed and well-nourished. No distress.  HENT:  Head: Normocephalic and atraumatic.  Right Ear: Hearing, tympanic membrane, external ear and ear canal normal.  Left Ear: Hearing, tympanic membrane, external ear and ear canal normal.  Nose: Nose  normal.  Mouth/Throat: Uvula is midline, oropharynx is clear and moist and mucous membranes are normal. No oropharyngeal exudate, posterior oropharyngeal edema or posterior oropharyngeal erythema.  Eyes: Conjunctivae and EOM are normal. Pupils are equal, round, and reactive to light. No scleral icterus.  Neck: Normal range of motion. Neck supple. No thyromegaly present.  Cardiovascular: Normal rate,  regular rhythm, normal heart sounds and intact distal pulses.   No murmur heard. Pulses:      Radial pulses are 2+ on the right side, and 2+ on the left side.  Pulmonary/Chest: Effort normal and breath sounds normal. No respiratory distress. He has no wheezes. He has no rales.  Abdominal: Soft. Bowel sounds are normal. He exhibits no distension and no mass. There is no tenderness. There is no rebound and no guarding.  Genitourinary: Rectum normal. Rectal exam shows no external hemorrhoid, no internal hemorrhoid, no fissure, no mass, no tenderness and anal tone normal. Prostate is enlarged (20gm). Prostate is not tender.  Musculoskeletal: Normal range of motion. He exhibits no edema.  Lymphadenopathy:    He has no cervical adenopathy.  Neurological: He is alert and oriented to person, place, and time.  CN grossly intact, station and gait intact  Skin: Skin is warm and dry. No rash noted.  Psychiatric: He has a normal mood and affect. His behavior is normal. Judgment and thought content normal.  Nursing note and vitals reviewed.  Results for orders placed or performed in visit on 07/01/15  T3, free  Result Value Ref Range   T3, Free 3.2 2.3 - 4.2 pg/mL  T4, free  Result Value Ref Range   Free T4 0.81 0.60 - 1.60 ng/dL  TSH  Result Value Ref Range   TSH 0.48 0.35 - 4.50 uIU/mL      Assessment & Plan:   Problem List Items Addressed This Visit    Vitamin B12 deficiency    Continues oral b12 daily Lab Results  Component Value Date   VITAMINB12 171* 10/08/2013        Relevant Orders   Vitamin B12   HLD (hyperlipidemia)    Check FLP today.      Relevant Orders   Lipid panel   Comprehensive metabolic panel   Healthcare maintenance - Primary    Preventative protocols reviewed and updated unless pt declined. Discussed healthy diet and lifestyle.       Graves disease (Chronic)    Back on low dose methimazole. Requests TFTs today. Followed by Dr Cruzita Lederer.      Relevant  Orders   TSH   T3   T4, free   BPH (benign prostatic hypertrophy) with urinary obstruction    Chronic, stable. Continue proscar and flomax. DRE stable today. As he's not had sxs with stopping proscar suggested restart both x1 mo then trial off flomax. Pt agrees.      Relevant Medications   finasteride (PROSCAR) 5 MG tablet   Other Relevant Orders   PSA       Follow up plan: Return in about 1 year (around 08/17/2016), or as needed, for annual exam, prior fasting for blood work.

## 2015-08-18 NOTE — Addendum Note (Signed)
Addended by: Ria Bush on: 08/18/2015 09:18 AM   Modules accepted: Miquel Dunn

## 2015-08-18 NOTE — Addendum Note (Signed)
Addended by: Marchia Bond on: 08/18/2015 04:18 PM   Modules accepted: Miquel Dunn

## 2015-08-18 NOTE — Patient Instructions (Addendum)
Restart proscar and after a few months trial off flomax.  Blood work today. Continue other medicines as up to now.   Health Maintenance, Male A healthy lifestyle and preventative care can promote health and wellness.  Maintain regular health, dental, and eye exams.  Eat a healthy diet. Foods like vegetables, fruits, whole grains, low-fat dairy products, and lean protein foods contain the nutrients you need and are low in calories. Decrease your intake of foods high in solid fats, added sugars, and salt. Get information about a proper diet from your health care provider, if necessary.  Regular physical exercise is one of the most important things you can do for your health. Most adults should get at least 150 minutes of moderate-intensity exercise (any activity that increases your heart rate and causes you to sweat) each week. In addition, most adults need muscle-strengthening exercises on 2 or more days a week.   Maintain a healthy weight. The body mass index (BMI) is a screening tool to identify possible weight problems. It provides an estimate of body fat based on height and weight. Your health care provider can find your BMI and can help you achieve or maintain a healthy weight. For males 20 years and older:  A BMI below 18.5 is considered underweight.  A BMI of 18.5 to 24.9 is normal.  A BMI of 25 to 29.9 is considered overweight.  A BMI of 30 and above is considered obese.  Maintain normal blood lipids and cholesterol by exercising and minimizing your intake of saturated fat. Eat a balanced diet with plenty of fruits and vegetables. Blood tests for lipids and cholesterol should begin at age 34 and be repeated every 5 years. If your lipid or cholesterol levels are high, you are over age 22, or you are at high risk for heart disease, you may need your cholesterol levels checked more frequently.Ongoing high lipid and cholesterol levels should be treated with medicines if diet and exercise  are not working.  If you smoke, find out from your health care provider how to quit. If you do not use tobacco, do not start.  Lung cancer screening is recommended for adults aged 58-80 years who are at high risk for developing lung cancer because of a history of smoking. A yearly low-dose CT scan of the lungs is recommended for people who have at least a 30-pack-year history of smoking and are current smokers or have quit within the past 15 years. A pack year of smoking is smoking an average of 1 pack of cigarettes a day for 1 year (for example, a 30-pack-year history of smoking could mean smoking 1 pack a day for 30 years or 2 packs a day for 15 years). Yearly screening should continue until the smoker has stopped smoking for at least 15 years. Yearly screening should be stopped for people who develop a health problem that would prevent them from having lung cancer treatment.  If you choose to drink alcohol, do not have more than 2 drinks per day. One drink is considered to be 12 oz (360 mL) of beer, 5 oz (150 mL) of wine, or 1.5 oz (45 mL) of liquor.  Avoid the use of street drugs. Do not share needles with anyone. Ask for help if you need support or instructions about stopping the use of drugs.  High blood pressure causes heart disease and increases the risk of stroke. High blood pressure is more likely to develop in:  People who have blood pressure in  the end of the normal range (100-139/85-89 mm Hg).  People who are overweight or obese.  People who are African American.  If you are 3-43 years of age, have your blood pressure checked every 3-5 years. If you are 15 years of age or older, have your blood pressure checked every year. You should have your blood pressure measured twice--once when you are at a hospital or clinic, and once when you are not at a hospital or clinic. Record the average of the two measurements. To check your blood pressure when you are not at a hospital or clinic, you  can use:  An automated blood pressure machine at a pharmacy.  A home blood pressure monitor.  If you are 37-25 years old, ask your health care provider if you should take aspirin to prevent heart disease.  Diabetes screening involves taking a blood sample to check your fasting blood sugar level. This should be done once every 3 years after age 84 if you are at a normal weight and without risk factors for diabetes. Testing should be considered at a younger age or be carried out more frequently if you are overweight and have at least 1 risk factor for diabetes.  Colorectal cancer can be detected and often prevented. Most routine colorectal cancer screening begins at the age of 12 and continues through age 54. However, your health care provider may recommend screening at an earlier age if you have risk factors for colon cancer. On a yearly basis, your health care provider may provide home test kits to check for hidden blood in the stool. A small camera at the end of a tube may be used to directly examine the colon (sigmoidoscopy or colonoscopy) to detect the earliest forms of colorectal cancer. Talk to your health care provider about this at age 8 when routine screening begins. A direct exam of the colon should be repeated every 5-10 years through age 85, unless early forms of precancerous polyps or small growths are found.  People who are at an increased risk for hepatitis B should be screened for this virus. You are considered at high risk for hepatitis B if:  You were born in a country where hepatitis B occurs often. Talk with your health care provider about which countries are considered high risk.  Your parents were born in a high-risk country and you have not received a shot to protect against hepatitis B (hepatitis B vaccine).  You have HIV or AIDS.  You use needles to inject street drugs.  You live with, or have sex with, someone who has hepatitis B.  You are a man who has sex with  other men (MSM).  You get hemodialysis treatment.  You take certain medicines for conditions like cancer, organ transplantation, and autoimmune conditions.  Hepatitis C blood testing is recommended for all people born from [birth year] through 1963 and any individual with known risk factors for hepatitis C.  Healthy men should no longer receive prostate-specific antigen (PSA) blood tests as part of routine cancer screening. Talk to your health care provider about prostate cancer screening.  Testicular cancer screening is not recommended for adolescents or adult males who have no symptoms. Screening includes self-exam, a health care provider exam, and other screening tests. Consult with your health care provider about any symptoms you have or any concerns you have about testicular cancer.  Practice safe sex. Use condoms and avoid high-risk sexual practices to reduce the spread of sexually transmitted infections (STIs).  You  should be screened for STIs, including gonorrhea and chlamydia if:  You are sexually active and are younger than 24 years.  You are older than 24 years, and your health care provider tells you that you are at risk for this type of infection.  Your sexual activity has changed since you were last screened, and you are at an increased risk for chlamydia or gonorrhea. Ask your health care provider if you are at risk.  If you are at risk of being infected with HIV, it is recommended that you take a prescription medicine daily to prevent HIV infection. This is called pre-exposure prophylaxis (PrEP). You are considered at risk if:  You are a man who has sex with other men (MSM).  You are a heterosexual man who is sexually active with multiple partners.  You take drugs by injection.  You are sexually active with a partner who has HIV.  Talk with your health care provider about whether you are at high risk of being infected with HIV. If you choose to begin PrEP, you should first be  tested for HIV. You should then be tested every 3 months for as long as you are taking PrEP.  Use sunscreen. Apply sunscreen liberally and repeatedly throughout the day. You should seek shade when your shadow is shorter than you. Protect yourself by wearing long sleeves, pants, a wide-brimmed hat, and sunglasses year round whenever you are outdoors.  Tell your health care provider of new moles or changes in moles, especially if there is a change in shape or color. Also, tell your health care provider if a mole is larger than the size of a pencil eraser.  A one-time screening for abdominal aortic aneurysm (AAA) and surgical repair of large AAAs by ultrasound is recommended for men aged 39-75 years who are current or former smokers.  Stay current with your vaccines (immunizations).   This information is not intended to replace advice given to you by your health care provider. Make sure you discuss any questions you have with your health care provider.   Document Released: 04/06/2008 Document Revised: 10/30/2014 Document Reviewed: 03/06/2011 Elsevier Interactive Patient Education [employer].

## 2015-08-23 ENCOUNTER — Encounter: Payer: Self-pay | Admitting: Internal Medicine

## 2015-08-24 ENCOUNTER — Other Ambulatory Visit: Payer: Self-pay | Admitting: Internal Medicine

## 2015-08-24 DIAGNOSIS — E0500 Thyrotoxicosis with diffuse goiter without thyrotoxic crisis or storm: Secondary | ICD-10-CM

## 2015-11-30 ENCOUNTER — Other Ambulatory Visit: Payer: Self-pay | Admitting: Internal Medicine

## 2015-11-30 ENCOUNTER — Other Ambulatory Visit (INDEPENDENT_AMBULATORY_CARE_PROVIDER_SITE_OTHER): Payer: 59

## 2015-11-30 DIAGNOSIS — E0500 Thyrotoxicosis with diffuse goiter without thyrotoxic crisis or storm: Secondary | ICD-10-CM

## 2015-11-30 LAB — TSH: TSH: 1.1 u[IU]/mL (ref 0.35–4.50)

## 2015-11-30 LAB — T3, FREE: T3, Free: 3.3 pg/mL (ref 2.3–4.2)

## 2015-11-30 LAB — T4, FREE: Free T4: 0.87 ng/dL (ref 0.60–1.60)

## 2015-11-30 MED ORDER — METHIMAZOLE 5 MG PO TABS: 2.5000 mg | ORAL_TABLET | Freq: Every day | ORAL | Status: DC

## 2016-03-22 ENCOUNTER — Ambulatory Visit (INDEPENDENT_AMBULATORY_CARE_PROVIDER_SITE_OTHER): Payer: 59 | Admitting: Physician Assistant

## 2016-03-22 VITALS — BP 110/76 | HR 74 | Temp 97.8°F | Resp 16 | Ht 69.0 in | Wt 178.0 lb

## 2016-03-22 DIAGNOSIS — E0500 Thyrotoxicosis with diffuse goiter without thyrotoxic crisis or storm: Secondary | ICD-10-CM | POA: Diagnosis not present

## 2016-03-22 DIAGNOSIS — M7712 Lateral epicondylitis, left elbow: Secondary | ICD-10-CM

## 2016-03-22 LAB — TSH: TSH: 1.48 mIU/L (ref 0.40–4.50)

## 2016-03-22 LAB — T3, FREE: T3, Free: 2.6 pg/mL (ref 2.3–4.2)

## 2016-03-22 LAB — T4, FREE: FREE T4: 1 ng/dL (ref 0.8–1.8)

## 2016-03-22 NOTE — Progress Notes (Signed)
William Tucker  MRN: [id number] DOB: [birth year]  Subjective:  Pt presents to clinic with left elbow pain that he has been treating at home for lateral epicondylitis with a elbow strap and ice and the pain is not getting better - he has some decreased strength with grip and grabbing and that worries him with his job.  He sometimes gets pain that goes into his index finger and thumb - he has pain with gripping motions and turning hand over - he would like an injection today to stop the pain.  He would like to have his labs drawn today for his endocrinologist.  Patient Active Problem List   Diagnosis Date Noted  . Vitamin B12 deficiency 10/13/2013  . Graves disease 10/08/2012  . Healthcare maintenance 10/08/2012  . PSORIASIS, MILD 03/09/2010  . ALLERGIC RHINITIS 02/02/2010  . HLD (hyperlipidemia) 01/09/2008  . BPH (benign prostatic hypertrophy) with urinary obstruction 01/09/2008    Current Outpatient Prescriptions on File Prior to Visit  Medication Sig Dispense Refill  . aspirin 81 MG tablet Take 81 mg by mouth every other day.     . cyanocobalamin 1000 MCG tablet Take 1,000 mcg by mouth daily.    Marland Kitchen loratadine (CLARITIN) 10 MG tablet Take 10 mg by mouth daily.      . methimazole (TAPAZOLE) 5 MG tablet Take 0.5 tablets (2.5 mg total) by mouth daily. 30 tablet 6  . Multiple Vitamin (MULTIVITAMIN) tablet Take 1 tablet by mouth daily.      . tamsulosin (FLOMAX) 0.4 MG CAPS capsule Take 1 capsule by mouth  once daily 90 capsule 3   No current facility-administered medications on file prior to visit.    No Known Allergies  Review of Systems  Constitutional: Negative for fever and chills.   Objective:  BP 110/76 mmHg  Pulse 74  Temp(Src) 97.8 F (36.6 C) (Oral)  Resp 16  Ht 5\' 9"  (1.753 m)  Wt 178 lb (80.74 kg)  BMI 26.27 kg/m2  SpO2 97%  Physical Exam  Constitutional: He is oriented to person, place, and time and well-developed, well-nourished, and in no distress.  HENT:   Head: Normocephalic and atraumatic.  Right Ear: External ear normal.  Left Ear: External ear normal.  Eyes: Conjunctivae are normal.  Neck: Normal range of motion.  Pulmonary/Chest: Effort normal.  Musculoskeletal:       Right elbow: Normal.      Left elbow: He exhibits normal range of motion, no swelling and no effusion. Tenderness found. Lateral epicondyle tenderness noted.  Pain with extension of wrist that increases with resistance - some discomfort with wrist flexion but no increase with resistance - no pain with supination but pain with pronation that increases with resistance   Neurological: He is alert and oriented to person, place, and time. Gait normal.  Skin: Skin is warm and dry.  Psychiatric: Mood, memory, affect and judgment normal.    Assessment and Plan :  Lateral epicondylitis (tennis elbow), left -  Pt has been using a elbow strap for 1 month and NSAIDs and he is not having pain resolution -   Graves disease - Plan: TSH, T3, Free, T4, Free, CANCELED: T3, free, CANCELED: T4, free, CANCELED: TSH - ordered labs while the patient was here for his endocrinologist  D/w pt that PT might be the better option verses an injection which he really would like to day because he wants to pain to just stop.  He would like to be referred to Dr  Yates for this evaluation  Windell Hummingbird PA-C  [facility] 03/22/2016 1:39 PM

## 2016-03-22 NOTE — Patient Instructions (Signed)
     IF you received an x-ray today, you will receive an invoice from [facility]. Please contact  Radiology at [phone] with questions or concerns regarding your invoice.   IF you received labwork today, you will receive an invoice from [facility]. Please contact [facility] at [phone] with questions or concerns regarding your invoice.   Our billing staff will not be able to assist you with questions regarding bills from these companies.  You will be contacted with the lab results as soon as they are available. The fastest way to get your results is to activate your My Chart account. Instructions are located on the last page of this paperwork. If you have not heard from us regarding the results in 2 weeks, please contact this office.      

## 2016-05-19 ENCOUNTER — Ambulatory Visit: Payer: 59 | Admitting: Internal Medicine

## 2016-06-19 ENCOUNTER — Other Ambulatory Visit (INDEPENDENT_AMBULATORY_CARE_PROVIDER_SITE_OTHER): Payer: 59

## 2016-06-19 ENCOUNTER — Ambulatory Visit (INDEPENDENT_AMBULATORY_CARE_PROVIDER_SITE_OTHER): Payer: 59 | Admitting: Internal Medicine

## 2016-06-19 ENCOUNTER — Encounter: Payer: Self-pay | Admitting: Internal Medicine

## 2016-06-19 VITALS — BP 120/84 | HR 65 | Wt 204.0 lb

## 2016-06-19 DIAGNOSIS — E538 Deficiency of other specified B group vitamins: Secondary | ICD-10-CM

## 2016-06-19 DIAGNOSIS — E0500 Thyrotoxicosis with diffuse goiter without thyrotoxic crisis or storm: Secondary | ICD-10-CM

## 2016-06-19 LAB — T4, FREE: Free T4: 0.72 ng/dL (ref 0.60–1.60)

## 2016-06-19 LAB — TSH: TSH: 0.72 u[IU]/mL (ref 0.35–4.50)

## 2016-06-19 LAB — T3, FREE: T3, Free: 3.1 pg/mL (ref 2.3–4.2)

## 2016-06-19 LAB — VITAMIN B12: VITAMIN B 12: 459 pg/mL (ref 211–911)

## 2016-06-19 NOTE — Progress Notes (Signed)
Patient ID: William Tucker, male   DOB: 1965/12/31, 50 y.o.   MRN: [id number]   HPI  William Tucker is a 50 y.o.-year-old male, returning for f/u for Graves ds. Last visit 1 year ago.  Reviewed and addended hx: Pt had a Thyroid Uptake and scan in 10/2012: Uptake 29%, at ULN, scan uniform, c/w Graves ds.  A thyroid U/S then showed a tiny nodule, no other defects.  He was initially on M MI 10 mg in am and 5 mg in pm >> we were able to taper it off but we had to restart at last visit (04/2015).  After this, we were again able to taper the [place] down and we stopped 02/2016.  I reviewed pt's thyroid tests: Lab Results  Component Value Date   TSH 1.48 03/22/2016   TSH 1.10 11/30/2015   TSH 0.80 08/18/2015   TSH 0.48 07/01/2015   TSH 0.09 (L) 05/20/2015   TSH 0.92 12/30/2014   TSH 1.68 11/19/2014   TSH 0.38 07/06/2014   TSH 0.86 05/18/2014   TSH 0.42 01/15/2014   FREET4 1.0 03/22/2016   FREET4 0.87 11/30/2015   FREET4 0.88 08/18/2015   FREET4 0.81 07/01/2015   FREET4 0.90 05/20/2015   FREET4 0.86 12/30/2014   FREET4 0.74 11/19/2014   FREET4 0.82 07/06/2014   FREET4 0.80 05/18/2014   FREET4 0.42 (L) 01/15/2014  TR Ab 2013 : 16.9% (<16)  Pt denies feeling nodules in neck, hoarseness, dysphagia/odynophagia, SOB with lying down.  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   ROS: Constitutional: see HPI Eyes: no blurry vision, no xerophthalmia ENT: no sore throat, no nodules palpated in throat, no dysphagia/odynophagia, no  hoarseness Cardiovascular: no CP/SOB/palpitations/leg swelling Respiratory: no cough/SOB Gastrointestinal: no N/V/D/C Musculoskeletal: no muscle/joint aches Skin: no rashes Neurological: no more tremors/no numbness/tingling/dizziness  I reviewed pt's medications, allergies, PMH, social hx, family hx, and changes were documented in the history of present illness. Otherwise, unchanged from my initial visit note.   PE: BP 120/84 (BP Location: Left Arm, Patient Position: Sitting)   Pulse 65   Wt 204 lb (92.5 kg)   SpO2 95%   BMI 30.13 kg/m  There is no height or weight on file to calculate BMI.  At home today: 181 lbs Wt Readings from Last 3 Encounters:  06/19/16 204 lb (92.5 kg)  03/22/16 178 lb (80.7 kg)  08/18/15 181 lb (82.1 kg)   Constitutional: normal weight, in NAD Eyes: PERRLA, EOMI, no exophthalmos, no lid lag, no stare ENT: moist mucous membranes, no thyromegaly, no cervical lymphadenopathy Cardiovascular: RRR, No MRG Respiratory:  - could not listen to chest - bulletproof vest Gastrointestinal: abdomen soft, NT, ND, BS+ Musculoskeletal: no deformities, strength intact Skin: moist, warm, no rashes Neurological: no tremor with outstretched hands, DTR normal in all 4  ASSESSMENT: 1. Graves Ds.  2. B12 def  PLAN:  1. Patient with long h/o Graves ds, without thyrotoxic sxs. At last visit, his TSH was low again  and he was feeling a little more fatigued >> we restarted a low MMI dose >> we were then able to taper the MMI to off (stopped 02/2016). At this visit, he still has pm fatigue and some weight gain. I advised him to get a sleep study as his fatigue couls be caused by OSA (his wife told him he snores). - will check TFTs today. Will add TSIs. - if TFTs become abnormal again >> will need to restart a low dose MMI and stay on the dose a little longer  - RTC in 1 year, but sooner for repeat labs  2. B12 def - will check a level - now on 1000 mg daily >> off for 3 weeks  now >> will restart  Appointment on 06/19/2016  Component Date Value Ref Range Status  . Free T4 06/19/2016 0.72  0.60 - 1.60 ng/dL Final  . T3, Free 06/19/2016 3.1  2.3 - 4.2 pg/mL Final  . TSH 06/19/2016 0.72  0.35 - 4.50 uIU/mL Final  . TSI 06/25/2016 93  <140 % baseline Final   Comment: Thyroid stimulating immunoglobulins (TSI) can engage the TSH receptors resulting in hyperthyroidism in Graves' disease patients. TSI levels can be useful in monitoring the clinical outcome of Graves' disease as well as assessing the potential for hyperthyroidism from maternal-fetal transfer. TSI results greater than or equal to (>=) 140% of the Reference Control are considered positive. NOTE: A serum TSH level greater than 350 micro-International Units/mL can interfere with the TSI bioassay and potentially give false positive results. Patients who are pregnant and are suspected of having hyperthyroidism should have both TSI and human Chorionic Gonadotropin(hCG) tests measured. A serum hCG level greater than 40,625 mIU/mL can interfere with the TSI bioassay and may give false negative results. In these patients it is recommended that a second TSI be obtained when the hCG concentration falls below 40,625 mIU/mL (usually after approximately 20-weeks gestation). The an                          alytical performance characteristics of this assay have been determined by [employer], [place], [place]. The modifications have not been cleared or approved by the FDA. This assay has been validated pursuant to the CLIA regulations and is used for clinical purposes.   . Vitamin B-12 06/19/2016 459  211 - 911 pg/mL Final   No need to restart MMI.  Philemon Kingdom, MD PhD [facility] Endocrinology

## 2016-06-19 NOTE — Patient Instructions (Addendum)
Please stop at the [facility] lab.  Please return in 1 year for a visit but in 6 months for labs.

## 2016-06-25 LAB — THYROID STIMULATING IMMUNOGLOBULIN: TSI: 93 % baseline (ref <140)

## 2016-07-07 ENCOUNTER — Other Ambulatory Visit: Payer: Self-pay | Admitting: Family Medicine

## 2016-07-30 ENCOUNTER — Other Ambulatory Visit: Payer: Self-pay | Admitting: Family Medicine

## 2016-09-28 ENCOUNTER — Other Ambulatory Visit: Payer: Self-pay | Admitting: Family Medicine

## 2016-10-19 ENCOUNTER — Other Ambulatory Visit: Payer: Self-pay | Admitting: Family Medicine

## 2016-12-07 ENCOUNTER — Other Ambulatory Visit: Payer: Self-pay | Admitting: Family Medicine

## 2016-12-07 NOTE — Telephone Encounter (Signed)
Pt called to ck on refill of tamsulosin and finestaride; advised pt refill already [facility]. Pt voiced understanding.

## 2017-01-11 ENCOUNTER — Encounter: Payer: Self-pay | Admitting: Family Medicine

## 2017-01-11 ENCOUNTER — Ambulatory Visit (INDEPENDENT_AMBULATORY_CARE_PROVIDER_SITE_OTHER): Payer: 59 | Admitting: Family Medicine

## 2017-01-11 VITALS — BP 132/90 | HR 64 | Temp 97.6°F | Wt 185.8 lb

## 2017-01-11 DIAGNOSIS — E785 Hyperlipidemia, unspecified: Secondary | ICD-10-CM | POA: Diagnosis not present

## 2017-01-11 DIAGNOSIS — Z9989 Dependence on other enabling machines and devices: Secondary | ICD-10-CM | POA: Insufficient documentation

## 2017-01-11 DIAGNOSIS — Z72820 Sleep deprivation: Secondary | ICD-10-CM | POA: Diagnosis not present

## 2017-01-11 DIAGNOSIS — E538 Deficiency of other specified B group vitamins: Secondary | ICD-10-CM

## 2017-01-11 DIAGNOSIS — N401 Enlarged prostate with lower urinary tract symptoms: Secondary | ICD-10-CM

## 2017-01-11 DIAGNOSIS — E0500 Thyrotoxicosis with diffuse goiter without thyrotoxic crisis or storm: Secondary | ICD-10-CM | POA: Diagnosis not present

## 2017-01-11 DIAGNOSIS — N138 Other obstructive and reflux uropathy: Secondary | ICD-10-CM

## 2017-01-11 DIAGNOSIS — Z0000 Encounter for general adult medical examination without abnormal findings: Secondary | ICD-10-CM | POA: Diagnosis not present

## 2017-01-11 DIAGNOSIS — G4733 Obstructive sleep apnea (adult) (pediatric): Secondary | ICD-10-CM | POA: Insufficient documentation

## 2017-01-11 LAB — TSH: TSH: 0.86 u[IU]/mL (ref 0.35–4.50)

## 2017-01-11 LAB — LIPID PANEL
CHOLESTEROL: 175 mg/dL (ref 0–200)
HDL: 50.7 mg/dL (ref >39.00)
LDL Cholesterol: 112 mg/dL — ABNORMAL HIGH (ref 0–99)
NONHDL: 124.61
Total CHOL/HDL Ratio: 3
Triglycerides: 61 mg/dL (ref 0.0–149.0)
VLDL: 12.2 mg/dL (ref 0.0–40.0)

## 2017-01-11 LAB — BASIC METABOLIC PANEL
BUN: 17 mg/dL (ref 6–23)
CHLORIDE: 105 meq/L (ref 96–112)
CO2: 28 mEq/L (ref 19–32)
Calcium: 9.5 mg/dL (ref 8.4–10.5)
Creatinine, Ser: 1.14 mg/dL (ref 0.40–1.50)
GFR: 72.55 mL/min (ref >60.00)
Glucose, Bld: 100 mg/dL — ABNORMAL HIGH (ref 70–99)
POTASSIUM: 4.2 meq/L (ref 3.5–5.1)
Sodium: 141 mEq/L (ref 135–145)

## 2017-01-11 LAB — PSA: PSA: 0.1 ng/mL (ref 0.10–4.00)

## 2017-01-11 MED ORDER — FINASTERIDE 5 MG PO TABS: 5.0000 mg | ORAL_TABLET | Freq: Every day | ORAL | 3 refills | Status: DC

## 2017-01-11 MED ORDER — TAMSULOSIN HCL 0.4 MG PO CAPS: 0.4000 mg | ORAL_CAPSULE | Freq: Every day | ORAL | 3 refills | Status: DC

## 2017-01-11 NOTE — Assessment & Plan Note (Signed)
Update FLP.  [place] risk 3.5% - ok to stop aspirin.

## 2017-01-11 NOTE — Assessment & Plan Note (Addendum)
Ongoing fatigue, daytime somnolence, non restorative sleep, snoring, and his fit bit shows low amount of deep sleep. Reasonable to refer to pulm for OSA evaluation. Pt agrees.

## 2017-01-11 NOTE — Assessment & Plan Note (Signed)
DRE reassuring. Check PSA today. Continue proscar. Trial skipping flomax intermittently, hopeful for taper off.

## 2017-01-11 NOTE — Assessment & Plan Note (Signed)
Preventative protocols reviewed and updated unless pt declined. Discussed healthy diet and lifestyle.  

## 2017-01-11 NOTE — Progress Notes (Signed)
BP 132/90   Pulse 64   Temp 97.6 F (36.4 C)   Wt 185 lb 12 oz (84.3 kg)   BMI 27.43 kg/m    CC: CPE Subjective:    Patient ID: William Tucker, male    DOB: 01-May-1966, 51 y.o.   MRN: [id number]  HPI: William Tucker is a 51 y.o. male presenting on 01/11/2017 for Annual Exam   Graves - followed by endo Dr Cruzita Lederer. Tapered off MMI (02/2016). Was asked to undergo sleep study to eval for OSA. Fit bit says he has poor sleep quality. Wife endorses he snores - so he has changed rooms. Daytime somnolence. No PNDyspnea. Denies significant hyperthyroid symptoms.   BPH - on flomax and proscar, prior saw Dr Terance Hart urologist. Denies LUTS sxs. Now followed here  Preventative: Prostate - gets checked yearly 2/2 alpha blocker and 5a reductase inhibitor.  Colon - had colonoscopy 2009 WNL for fmhx, rpt at age 71yo. Flu shot - declines  Tetanus - 2011  Seat belt use discussed Sunscreen use discussed. No changing moles on skin. Sees dermatologist regularly. Non smoker Alcohol - occasional  Caffeine: 3-4 cups coffee/day  Married and lives with wife and 2 sons  Occupation: Event organiser  Activity: walks several miles daily  Diet: good water, fruits/vegetables daily   Relevant past medical, surgical, family and social history reviewed and updated as indicated. Interim medical history since our last visit reviewed. Allergies and medications reviewed and updated. Outpatient Medications Prior to Visit  Medication Sig Dispense Refill  . cyanocobalamin 1000 MCG tablet Take 1,000 mcg by mouth daily.    Marland Kitchen loratadine (CLARITIN) 10 MG tablet Take 10 mg by mouth daily.      . Multiple Vitamin (MULTIVITAMIN) tablet Take 1 tablet by mouth daily.      Marland Kitchen aspirin 81 MG tablet Take 81 mg by mouth every other day.     . finasteride (PROSCAR) 5 MG tablet     . finasteride (PROSCAR) 5 MG tablet Take 1 tablet (5 mg total) by mouth daily. **MUST HAVE PHYSICAL FOR FURTHER REFILLS** 90 tablet 0  .  finasteride (PROSCAR) 5 MG tablet TAKE 1 TABLET BY MOUTH  DAILY 90 tablet 0  . methimazole (TAPAZOLE) 5 MG tablet Take 0.5 tablets (2.5 mg total) by mouth daily. (Patient not taking: Reported on 06/19/2016) 30 tablet 6  . tamsulosin (FLOMAX) 0.4 MG CAPS capsule TAKE 1 CAPSULE BY MOUTH  DAILY 90 capsule 0   No facility-administered medications prior to visit.      Per HPI unless specifically indicated in ROS section below Review of Systems  Constitutional: Negative for activity change, appetite change, chills, fatigue, fever and unexpected weight change.  HENT: Negative for hearing loss.   Eyes: Negative for visual disturbance.  Respiratory: Negative for cough, chest tightness, shortness of breath and wheezing.   Cardiovascular: Negative for chest pain, palpitations and leg swelling.  Gastrointestinal: Negative for abdominal distention, abdominal pain, blood in stool, constipation, diarrhea, nausea and vomiting.  Genitourinary: Negative for difficulty urinating and hematuria.  Musculoskeletal: Negative for arthralgias, myalgias and neck pain.  Skin: Negative for rash.  Neurological: Negative for dizziness, seizures, syncope and headaches.  Hematological: Negative for adenopathy. Does not bruise/bleed easily.  Psychiatric/Behavioral: Negative for dysphoric mood. The patient is not nervous/anxious.       Objective:    BP 132/90   Pulse 64   Temp 97.6 F (36.4 C)   Wt 185 lb 12 oz (84.3 kg)  BMI 27.43 kg/m   Wt Readings from Last 3 Encounters:  01/11/17 185 lb 12 oz (84.3 kg)  06/19/16 204 lb (92.5 kg)  03/22/16 178 lb (80.7 kg)    Physical Exam  Constitutional: He is oriented to person, place, and time. He appears well-developed and well-nourished. No distress.  HENT:  Head: Normocephalic and atraumatic.  Right Ear: Hearing, tympanic membrane, external ear and ear canal normal.  Left Ear: Hearing, tympanic membrane, external ear and ear canal normal.  Nose: Nose normal.    Mouth/Throat: Uvula is midline, oropharynx is clear and moist and mucous membranes are normal. No oropharyngeal exudate, posterior oropharyngeal edema or posterior oropharyngeal erythema.  Eyes: Conjunctivae and EOM are normal. Pupils are equal, round, and reactive to light. No scleral icterus.  Neck: Normal range of motion. Neck supple.  Cardiovascular: Normal rate, regular rhythm, normal heart sounds and intact distal pulses.   No murmur heard. Pulses:      Radial pulses are 2+ on the right side, and 2+ on the left side.  Pulmonary/Chest: Effort normal and breath sounds normal. No respiratory distress. He has no wheezes. He has no rales.  Abdominal: Soft. Bowel sounds are normal. He exhibits no distension and no mass. There is no tenderness. There is no rebound and no guarding.  Genitourinary: Rectum normal and prostate normal. Rectal exam shows no external hemorrhoid, no internal hemorrhoid, no fissure, no mass, no tenderness and anal tone normal. Prostate is not enlarged (20gm) and not tender.  Musculoskeletal: Normal range of motion. He exhibits no edema.  Lymphadenopathy:    He has no cervical adenopathy.  Neurological: He is alert and oriented to person, place, and time.  CN grossly intact, station and gait intact  Skin: Skin is warm and dry. No rash noted.  Psychiatric: He has a normal mood and affect. His behavior is normal. Judgment and thought content normal.  Nursing note and vitals reviewed.  Results for orders placed or performed in visit on 06/19/16  T4, free  Result Value Ref Range   Free T4 0.72 0.60 - 1.60 ng/dL  T3, free  Result Value Ref Range   T3, Free 3.1 2.3 - 4.2 pg/mL  TSH  Result Value Ref Range   TSH 0.72 0.35 - 4.50 uIU/mL  Thyroid Stimulating Immunoglobulin  Result Value Ref Range   TSI 93 <140 % baseline  Vitamin B12  Result Value Ref Range   Vitamin B-12 459 211 - 911 pg/mL      Assessment & Plan:   Problem List Items Addressed This Visit     Benign prostatic hyperplasia with urinary obstruction    DRE reassuring. Check PSA today. Continue proscar. Trial skipping flomax intermittently, hopeful for taper off.       Relevant Medications   tamsulosin (FLOMAX) 0.4 MG CAPS capsule   finasteride (PROSCAR) 5 MG tablet   Other Relevant Orders   PSA   Graves disease (Chronic)    Update labs. Off methimazole.  Appreciate endo care.       Relevant Orders   TSH   Healthcare maintenance - Primary    Preventative protocols reviewed and updated unless pt declined. Discussed healthy diet and lifestyle.       HLD (hyperlipidemia)    Update FLP.  Framingham risk 3.5% - ok to stop aspirin.       Relevant Orders   Lipid panel   Basic metabolic panel   Poor sleep    Ongoing fatigue, daytime somnolence, non restorative  sleep, snoring, and his fit bit shows low amount of deep sleep. Reasonable to refer to pulm for OSA evaluation. Pt agrees.       Relevant Orders   Ambulatory referral to Pulmonology   Vitamin B12 deficiency    Regular with vit B12 103mcg daily.           Follow up plan: Return in about 1 year (around 01/11/2018) for annual exam, prior fasting for blood work.  Ria Bush, MD

## 2017-01-11 NOTE — Patient Instructions (Addendum)
We will refer you to lung doctor to discuss sleep study.  Labs today.  Skip flomax every once in a while to see if tolerated well.  You are doing well today. Return as needed or in 1 year for next physical.   Health Maintenance, Male A healthy lifestyle and preventive care is important for your health and wellness. Ask your health care provider about what schedule of regular examinations is right for you. What should I know about weight and diet?  Eat a Healthy Diet  Eat plenty of vegetables, fruits, whole grains, low-fat dairy products, and lean protein.  Do not eat a lot of foods high in solid fats, added sugars, or salt. Maintain a Healthy Weight  Regular exercise can help you achieve or maintain a healthy weight. You should:  Do at least 150 minutes of exercise each week. The exercise should increase your heart rate and make you sweat (moderate-intensity exercise).  Do strength-training exercises at least twice a week. Watch Your Levels of Cholesterol and Blood Lipids  Have your blood tested for lipids and cholesterol every 5 years starting at 49 years of age. If you are at high risk for heart disease, you should start having your blood tested when you are 49 years old. You may need to have your cholesterol levels checked more often if:  Your lipid or cholesterol levels are high.  You are older than 49 years of age.  You are at high risk for heart disease. What should I know about cancer screening? Many types of cancers can be detected early and may often be prevented. Lung Cancer  You should be screened every year for lung cancer if:  You are a current smoker who has smoked for at least 30 years.  You are a former smoker who has quit within the past 15 years.  Talk to your health care provider about your screening options, when you should start screening, and how often you should be screened. Colorectal Cancer  Routine colorectal cancer screening usually begins at 49  years of age and should be repeated every 5-10 years until you are 49 years old. You may need to be screened more often if early forms of precancerous polyps or small growths are found. Your health care provider may recommend screening at an earlier age if you have risk factors for colon cancer.  Your health care provider may recommend using home test kits to check for hidden blood in the stool.  A small camera at the end of a tube can be used to examine your colon (sigmoidoscopy or colonoscopy). This checks for the earliest forms of colorectal cancer. Prostate and Testicular Cancer  Depending on your age and overall health, your health care provider may do certain tests to screen for prostate and testicular cancer.  Talk to your health care provider about any symptoms or concerns you have about testicular or prostate cancer. Skin Cancer  Check your skin from head to toe regularly.  Tell your health care provider about any new moles or changes in moles, especially if:  There is a change in a mole's size, shape, or color.  You have a mole that is larger than a pencil eraser.  Always use sunscreen. Apply sunscreen liberally and repeat throughout the day.  Protect yourself by wearing long sleeves, pants, a wide-brimmed hat, and sunglasses when outside. What should I know about heart disease, diabetes, and high blood pressure?  If you are 3-104 years of age, have your blood  pressure checked every 3-5 years. If you are 31 years of age or older, have your blood pressure checked every year. You should have your blood pressure measured twice-once when you are at a hospital or clinic, and once when you are not at a hospital or clinic. Record the average of the two measurements. To check your blood pressure when you are not at a hospital or clinic, you can use:  An automated blood pressure machine at a pharmacy.  A home blood pressure monitor.  Talk to your health care provider about your target  blood pressure.  If you are between 7-44 years old, ask your health care provider if you should take aspirin to prevent heart disease.  Have regular diabetes screenings by checking your fasting blood sugar level.  If you are at a normal weight and have a low risk for diabetes, have this test once every three years after the age of 78.  If you are overweight and have a high risk for diabetes, consider being tested at a younger age or more often.  A one-time screening for abdominal aortic aneurysm (AAA) by ultrasound is recommended for men aged 63-75 years who are current or former smokers. What should I know about preventing infection? Hepatitis B  If you have a higher risk for hepatitis B, you should be screened for this virus. Talk with your health care provider to find out if you are at risk for hepatitis B infection. Hepatitis C  Blood testing is recommended for:  Everyone born from [birth year] through 1963.  Anyone with known risk factors for hepatitis C. Sexually Transmitted Diseases (STDs)  You should be screened each year for STDs including gonorrhea and chlamydia if:  You are sexually active and are younger than 49 years of age.  You are older than 49 years of age and your health care provider tells you that you are at risk for this type of infection.  Your sexual activity has changed since you were last screened and you are at an increased risk for chlamydia or gonorrhea. Ask your health care provider if you are at risk.  Talk with your health care provider about whether you are at high risk of being infected with HIV. Your health care provider may recommend a prescription medicine to help prevent HIV infection. What else can I do?  Schedule regular health, dental, and eye exams.  Stay current with your vaccines (immunizations).  Do not use any tobacco products, such as cigarettes, chewing tobacco, and e-cigarettes. If you need help quitting, ask your health care  provider.  Limit alcohol intake to no more than 2 drinks per day. One drink equals 12 ounces of beer, 5 ounces of wine, or 1 ounces of hard liquor.  Do not use street drugs.  Do not share needles.  Ask your health care provider for help if you need support or information about quitting drugs.  Tell your health care provider if you often feel depressed.  Tell your health care provider if you have ever been abused or do not feel safe at home. This information is not intended to replace advice given to you by your health care provider. Make sure you discuss any questions you have with your health care provider. Document Released: 04/06/2008 Document Revised: 06/07/2016 Document Reviewed: 07/13/2015 Elsevier Interactive Patient Education  2017 [employer].

## 2017-01-11 NOTE — Assessment & Plan Note (Addendum)
Update labs. Off methimazole.  Appreciate endo care.

## 2017-01-11 NOTE — Assessment & Plan Note (Signed)
Regular with vit B12 1063mcg daily.

## 2017-06-19 ENCOUNTER — Ambulatory Visit (INDEPENDENT_AMBULATORY_CARE_PROVIDER_SITE_OTHER): Payer: 59 | Admitting: Internal Medicine

## 2017-06-19 ENCOUNTER — Encounter: Payer: Self-pay | Admitting: Internal Medicine

## 2017-06-19 VITALS — BP 128/82 | HR 73 | Wt 204.0 lb

## 2017-06-19 DIAGNOSIS — E0500 Thyrotoxicosis with diffuse goiter without thyrotoxic crisis or storm: Secondary | ICD-10-CM | POA: Diagnosis not present

## 2017-06-19 DIAGNOSIS — E538 Deficiency of other specified B group vitamins: Secondary | ICD-10-CM

## 2017-06-19 LAB — VITAMIN B12: Vitamin B-12: 1398 pg/mL — ABNORMAL HIGH (ref 211–911)

## 2017-06-19 LAB — T3, FREE: T3, Free: 3.6 pg/mL (ref 2.3–4.2)

## 2017-06-19 LAB — TSH: TSH: 0.96 u[IU]/mL (ref 0.35–4.50)

## 2017-06-19 LAB — T4, FREE: Free T4: 0.68 ng/dL (ref 0.60–1.60)

## 2017-06-19 NOTE — Patient Instructions (Signed)
Please continue off the methimazole.  Please stop at the lab.  Please return in 1 year.

## 2017-06-19 NOTE — Progress Notes (Signed)
Patient ID: William Tucker, male   DOB: [birth year], 51 y.o.   MRN: [id number]   HPI  William Tucker is a 51 y.o.-year-old male, returning for f/u for Graves ds and B12 def. Last visit 1 year ago.  Graves ds: Reviewed hx: Pt had a Thyroid Uptake and scan in 10/2012: Uptake 29%, at ULN, scan uniform, c/w Graves ds.  A thyroid U/S then showed a tiny nodule, no other defects.  He was initially on methimazole 10 mg in a.m. and 5 mg in p.m., and we were able to taper the dose off >> however, we had to restart in 04/2015, after which we were again able to taper the methimazole down and finally stopped 02/2016. She had 2 sets of labs afterwards there were normal off the medication.  I reviewed patient's thyroid tests: Lab Results  Component Value Date   TSH 0.86 01/11/2017   TSH 0.72 06/19/2016   TSH 1.48 03/22/2016   TSH 1.10 11/30/2015   TSH 0.80 08/18/2015   TSH 0.48 07/01/2015   TSH 0.09 (L) 05/20/2015   TSH 0.92 12/30/2014   TSH 1.68 11/19/2014   TSH 0.38 07/06/2014   FREET4 0.72 06/19/2016   FREET4 1.0 03/22/2016   FREET4 0.87 11/30/2015   FREET4 0.88 08/18/2015   FREET4 0.81 07/01/2015   FREET4 0.90 05/20/2015   FREET4 0.86 12/30/2014   FREET4 0.74 11/19/2014   FREET4 0.82 07/06/2014   FREET4 0.80 05/18/2014  TR Ab 2013 : 16.9% (<16)  Lab Results  Component Value Date   TSI 93 06/19/2016    Pt denies: - feeling nodules in neck - hoarseness - dysphagia - choking - SOB with lying down  Pt denies: - weight loss - heat intolerance - tremors - had these before from drinking too much coffee  - palpitations - anxiety - hyperdefecation - hair loss  He also has a history of BPH - [facility], HL, psoriasis, leukopenia.  He also has vitamin B-12 deficiency:   Latest B12 levels: Lab Results  Component Value Date   VITAMINB12 459 06/19/2016   VITAMINB12 533 08/18/2015   VITAMINB12 171 (L) 10/08/2013   VITAMINB12 265 09/24/2006   He is on over-the-counter  vitamin B12 - 5000 mcg daily. He missed the dose x 2 weeks >> more tired. He restarted it >1 mo month.  He started a testosterone booster 1 mo ago >> feels better.  ROS: Constitutional: + see HPI Eyes: no blurry vision, no xerophthalmia ENT: no sore throat, + see HPI Cardiovascular: no CP/no SOB/no palpitations/no leg swelling Respiratory: no cough/no SOB/no wheezing Gastrointestinal: no N/no V/no D/no C/no acid reflux Musculoskeletal: no muscle aches/no joint aches Skin: no rashes, no hair loss Neurological: no tremors/no numbness/no tingling/no dizziness  I reviewed pt's medications, allergies, PMH, social hx, family hx, and changes were documented in the history of present illness. Otherwise, unchanged from my initial visit note.  PE: BP 128/82 (BP Location: Left Arm, Patient Position: Sitting)   Pulse 73   Wt 204 lb (92.5 kg)   SpO2 95%   BMI 30.13 kg/m  Body mass index is 30.13 kg/m.   Wt Readings from Last 3 Encounters:  06/19/17 204 lb (92.5 kg)  01/11/17 185 lb 12 oz (84.3 kg)  06/19/16 204 lb (92.5 kg)   Constitutional: overweight, in NAD Eyes: PERRLA, EOMI, no exophthalmos, no lid lag, no stare ENT: moist mucous membranes, no thyromegaly, no cervical lymphadenopathy Cardiovascular: RRR, No MRG Respiratory: - could not listen to  chest >> bulletproof vest Gastrointestinal: abdomen soft, NT, ND, BS+ Musculoskeletal: no deformities, strength intact in all 4 Skin: moist, warm, no rashes Neurological: no tremor with outstretched hands, DTR normal in all 4   ASSESSMENT: 1. Graves Ds.  2. B12 def  PLAN:  1. Patient with long history of Graves' disease, without thyrotoxic symptoms. He was initially on methimazole, which he was able to taper to off definitively in 02/2016. Since then, he had 2 sets of TFTs that remained normal. - At last visit, he complained of fatigue and I advised him to get the sleep study as this could have been caused by OSA (he snores). He did  not have this yet. He feels his fatigue is mostly 2/2 B12 def, but he recently started a plant-based testosterone booster and feels better on this. I still encouraged him to get a sleep study. - We will continue off the methimazole and check TFTs today. - will also repeat TSI's to check for trend  - If TFTs become abnormal again, we will need to restart a low-dose methimazole and continue on it long-term - I will see him back in one year, but may need to return sooner for repeat labs  2. B12 def - Reviewed previous levels: Last obtained a year ago >> normal - We'll continue with 5000 g B12 daily, but we discussed that we may need to change to qod - We will check a level today    Office Visit on 06/19/2017  Component Date Value Ref Range Status  . TSH 06/19/2017 0.96  0.35 - 4.50 uIU/mL Final  . Free T4 06/19/2017 0.68  0.60 - 1.60 ng/dL Final   Comment: Specimens from patients who are undergoing biotin therapy and /or ingesting biotin supplements may contain high levels of biotin.  The higher biotin concentration in these specimens interferes with this Free T4 assay.  Specimens that contain high levels  of biotin may cause false high results for this Free T4 assay.  Please interpret results in light of the total clinical presentation of the patient.    . T3, Free 06/19/2017 3.6  2.3 - 4.2 pg/mL Final  . Vitamin B-12 06/19/2017 1398* 211 - 911 pg/mL Final   Msg sent: Dear William Tucker, Thyroid labs are great! B12 is a little high, as expected >> you can try to take the 5000 mcg of B12 every other day and see how you feel. The Graves Antibodies are not back yet. Sincerely, Philemon Kingdom MD  Philemon Kingdom, MD PhD [facility]

## 2017-06-23 LAB — THYROID STIMULATING IMMUNOGLOBULIN: TSI: <89 % baseline (ref <140)

## 2017-07-31 DIAGNOSIS — D2262 Melanocytic nevi of left upper limb, including shoulder: Secondary | ICD-10-CM | POA: Diagnosis not present

## 2017-07-31 DIAGNOSIS — D2261 Melanocytic nevi of right upper limb, including shoulder: Secondary | ICD-10-CM | POA: Diagnosis not present

## 2017-07-31 DIAGNOSIS — D225 Melanocytic nevi of trunk: Secondary | ICD-10-CM | POA: Diagnosis not present

## 2017-09-26 DIAGNOSIS — H40013 Open angle with borderline findings, low risk, bilateral: Secondary | ICD-10-CM | POA: Diagnosis not present

## 2018-01-03 ENCOUNTER — Encounter: Payer: Self-pay | Admitting: Internal Medicine

## 2018-01-04 ENCOUNTER — Encounter: Payer: Self-pay | Admitting: Internal Medicine

## 2018-01-24 ENCOUNTER — Other Ambulatory Visit: Payer: Self-pay | Admitting: Family Medicine

## 2018-02-15 DIAGNOSIS — J209 Acute bronchitis, unspecified: Secondary | ICD-10-CM | POA: Diagnosis not present

## 2018-02-20 ENCOUNTER — Encounter: Payer: Self-pay | Admitting: *Deleted

## 2018-02-20 HISTORY — PX: COLONOSCOPY: SHX174

## 2018-03-07 ENCOUNTER — Ambulatory Visit (AMBULATORY_SURGERY_CENTER): Payer: Self-pay

## 2018-03-07 VITALS — Ht 69.0 in | Wt 182.6 lb

## 2018-03-07 DIAGNOSIS — Z8371 Family history of colonic polyps: Secondary | ICD-10-CM

## 2018-03-07 NOTE — Progress Notes (Signed)
Per pt, no allergies to soy or egg products.Pt not taking any weight loss meds or using  O2 at home.  Pt refused emmi video. 

## 2018-03-14 ENCOUNTER — Encounter: Payer: Self-pay | Admitting: Internal Medicine

## 2018-03-21 ENCOUNTER — Encounter: Payer: Self-pay | Admitting: Internal Medicine

## 2018-03-21 ENCOUNTER — Ambulatory Visit (AMBULATORY_SURGERY_CENTER): Payer: 59 | Admitting: Internal Medicine

## 2018-03-21 ENCOUNTER — Other Ambulatory Visit: Payer: Self-pay

## 2018-03-21 VITALS — BP 105/81 | HR 60 | Temp 98.2°F | Resp 7 | Ht 69.0 in | Wt 182.6 lb

## 2018-03-21 DIAGNOSIS — K635 Polyp of colon: Secondary | ICD-10-CM | POA: Diagnosis not present

## 2018-03-21 DIAGNOSIS — D123 Benign neoplasm of transverse colon: Secondary | ICD-10-CM

## 2018-03-21 DIAGNOSIS — D126 Benign neoplasm of colon, unspecified: Secondary | ICD-10-CM

## 2018-03-21 DIAGNOSIS — Z1211 Encounter for screening for malignant neoplasm of colon: Secondary | ICD-10-CM

## 2018-03-21 DIAGNOSIS — Z8371 Family history of colonic polyps: Secondary | ICD-10-CM | POA: Diagnosis not present

## 2018-03-21 DIAGNOSIS — D120 Benign neoplasm of cecum: Secondary | ICD-10-CM

## 2018-03-21 MED ORDER — SODIUM CHLORIDE 0.9 % IV SOLN: 500.0000 mL | INTRAVENOUS | Status: DC

## 2018-03-21 NOTE — Op Note (Signed)
[place] Patient Name: William Tucker Procedure Date: 03/21/2018 8:37 AM MRN: [id number] Endoscopist: Gatha Mayer , MD Age: 52 Referring MD:  Date of Birth: 12-26-1965 Gender: Male Account #: [id number] Procedure:                Colonoscopy Indications:              Screening for colon cancer: Family history of                            colorectal cancer in distant relative(s), Colon                            cancer screening in patient at increased risk:                            Family history of 1st-degree relative with colon                            polyps before age 32 years Medicines:                Propofol per Anesthesia, Monitored Anesthesia Care Procedure:                Pre-Anesthesia Assessment:                           - Prior to the procedure, a History and Physical                            was performed, and patient medications and                            allergies were reviewed. The patient's tolerance of                            previous anesthesia was also reviewed. The risks                            and benefits of the procedure and the sedation                            options and risks were discussed with the patient.                            All questions were answered, and informed consent                            was obtained. Prior Anticoagulants: The patient has                            taken no previous anticoagulant or antiplatelet                            agents. ASA Grade Assessment: II - A patient with  mild systemic disease. After reviewing the risks                            and benefits, the patient was deemed in                            satisfactory condition to undergo the procedure.                           After obtaining informed consent, the colonoscope                            was passed under direct vision. Throughout the                            procedure, the  patient's blood pressure, pulse, and                            oxygen saturations were monitored continuously. The                            Model CF-HQ190L [id number]) scope was introduced                            through the anus and advanced to the the cecum,                            identified by appendiceal orifice and ileocecal                            valve. The colonoscopy was performed without                            difficulty. The patient tolerated the procedure                            well. The quality of the bowel preparation was                            good. The ileocecal valve, appendiceal orifice, and                            rectum were photographed. The quality of the bowel                            preparation was good. The bowel preparation used                            was Miralax. Scope In: 8:49:05 AM Scope Out: 9:09:55 AM Scope Withdrawal Time: 0 hours 18 minutes 1 second  Total Procedure Duration: 0 hours 20 minutes 50 seconds  Findings:                 The perianal and digital rectal examinations were  normal.                           Three flat and sessile polyps were found in the                            transverse colon and cecum. The polyps were                            diminutive in size. These polyps were removed with                            a cold snare. Resection and retrieval were                            complete. Verification of patient identification                            for the specimen was done. Estimated blood loss was                            minimal.                           Internal hemorrhoids were found during retroflexion. Complications:            No immediate complications. Estimated Blood Loss:     Estimated blood loss was minimal. Impression:               - Three diminutive polyps in the transverse colon                            and in the cecum, removed with a cold  snare.                            Resected and retrieved.                           - The examination was otherwise normal on direct                            and retroflexion views. Recommendation:           - Patient has a contact number available for                            emergencies. The signs and symptoms of potential                            delayed complications were discussed with the                            patient. Return to normal activities tomorrow.                            Written discharge instructions were provided to  the                            patient.                           - Resume previous diet.                           - Continue present medications.                           - Repeat colonoscopy is recommended. The                            colonoscopy date will be determined after pathology                            results from today's exam become available for                            review. Gatha Mayer, MD 03/21/2018 9:16:37 AM This report has been signed electronically.

## 2018-03-21 NOTE — Patient Instructions (Addendum)
   I found and removed 3 tiny polyps - no signs of cancer.  I will let you know pathology results and when to have another routine colonoscopy by mail and/or My Chart.  I appreciate the opportunity to care for you. Gatha Mayer, MD, [facility]  **Handouts given on polyps and hemorrhoids**   YOU HAD AN ENDOSCOPIC PROCEDURE TODAY: Refer to the procedure report and other information in the discharge instructions given to you for any specific questions about what was found during the examination. If this information does not answer your questions, please call [place] office at [phone] to clarify.   YOU SHOULD EXPECT: Some feelings of bloating in the abdomen. Passage of more gas than usual. Walking can help get rid of the air that was put into your GI tract during the procedure and reduce the bloating. If you had a lower endoscopy (such as a colonoscopy or flexible sigmoidoscopy) you may notice spotting of blood in your stool or on the toilet paper. Some abdominal soreness may be present for a day or two, also.  DIET: Your first meal following the procedure should be a light meal and then it is ok to progress to your normal diet. A half-sandwich or bowl of soup is an example of a good first meal. Heavy or fried foods are harder to digest and may make you feel nauseous or bloated. Drink plenty of fluids but you should avoid alcoholic beverages for 24 hours. If you had a esophageal dilation, please see attached instructions for diet.    ACTIVITY: Your care partner should take you home directly after the procedure. You should plan to take it easy, moving slowly for the rest of the day. You can resume normal activity the day after the procedure however YOU SHOULD NOT DRIVE, use power tools, machinery or perform tasks that involve climbing or major physical exertion for 24 hours (because of the sedation medicines used during the test).   SYMPTOMS TO REPORT IMMEDIATELY: A gastroenterologist can be  reached at any hour. Please call [phone]  for any of the following symptoms:  Following lower endoscopy (colonoscopy, flexible sigmoidoscopy) Excessive amounts of blood in the stool  Significant tenderness, worsening of abdominal pains  Swelling of the abdomen that is new, acute  Fever of 100 or higher    FOLLOW UP:  If any biopsies were taken you will be contacted by phone or by letter within the next 1-3 weeks. Call [phone]  if you have not heard about the biopsies in 3 weeks.  Please also call with any specific questions about appointments or follow up tests.

## 2018-03-21 NOTE — Progress Notes (Signed)
Report given to PACU, vss 

## 2018-03-21 NOTE — Progress Notes (Signed)
Called to room to assist during endoscopic procedure.  Patient ID and intended procedure confirmed with present staff. Received instructions for my participation in the procedure from the performing physician.  

## 2018-03-22 ENCOUNTER — Telehealth: Payer: Self-pay

## 2018-03-22 NOTE — Telephone Encounter (Signed)
  Follow up Call-  Call back number 03/21/2018  Post procedure Call Back phone  # [phone]  Permission to leave phone message Yes  Some recent data might be hidden     Patient questions:  Do you have a fever, pain , or abdominal swelling? No. Pain Score  0 *  Have you tolerated food without any problems? Yes.    Have you been able to return to your normal activities? Yes.    Do you have any questions about your discharge instructions: Diet   No. Medications  No. Follow up visit  No.  Do you have questions or concerns about your Care? No.  Actions: * If pain score is 4 or above: No action needed, pain <4.

## 2018-03-23 ENCOUNTER — Encounter: Payer: Self-pay | Admitting: Family Medicine

## 2018-03-29 ENCOUNTER — Encounter: Payer: Self-pay | Admitting: Internal Medicine

## 2018-03-29 DIAGNOSIS — Z860100 Personal history of colon polyps, unspecified: Secondary | ICD-10-CM | POA: Insufficient documentation

## 2018-03-29 HISTORY — DX: Personal history of colonic polyps: Z86.010

## 2018-03-29 HISTORY — DX: Personal history of colon polyps, unspecified: Z86.0100

## 2018-03-29 NOTE — Progress Notes (Signed)
2 right hyperplastic polyps and 1 benign mucosal polyp Has FHx CRCA + polyps also Will repeat colon 2024 My Chart letter

## 2018-04-01 ENCOUNTER — Encounter: Payer: Self-pay | Admitting: Family Medicine

## 2018-04-06 ENCOUNTER — Other Ambulatory Visit: Payer: Self-pay | Admitting: Family Medicine

## 2018-06-01 ENCOUNTER — Other Ambulatory Visit: Payer: Self-pay | Admitting: Family Medicine

## 2018-06-19 ENCOUNTER — Ambulatory Visit: Payer: 59 | Admitting: Internal Medicine

## 2018-06-19 ENCOUNTER — Encounter: Payer: Self-pay | Admitting: Internal Medicine

## 2018-06-19 VITALS — BP 140/90 | HR 80 | Ht 69.0 in | Wt 187.0 lb

## 2018-06-19 DIAGNOSIS — R590 Localized enlarged lymph nodes: Secondary | ICD-10-CM | POA: Diagnosis not present

## 2018-06-19 DIAGNOSIS — E0500 Thyrotoxicosis with diffuse goiter without thyrotoxic crisis or storm: Secondary | ICD-10-CM

## 2018-06-19 DIAGNOSIS — E538 Deficiency of other specified B group vitamins: Secondary | ICD-10-CM | POA: Diagnosis not present

## 2018-06-19 LAB — T3, FREE: T3 FREE: 3.6 pg/mL (ref 2.3–4.2)

## 2018-06-19 LAB — TSH: TSH: 1.17 u[IU]/mL (ref 0.35–4.50)

## 2018-06-19 LAB — VITAMIN B12: Vitamin B-12: 349 pg/mL (ref 211–911)

## 2018-06-19 LAB — T4, FREE: FREE T4: 0.77 ng/dL (ref 0.60–1.60)

## 2018-06-19 NOTE — Progress Notes (Signed)
Patient ID: William Tucker, male   DOB: 1966-04-13, 52 y.o.   MRN: [id number]   HPI  William Tucker is a 52 y.o.-year-old male, returning for f/u for Graves ds and B12 def. Last visit a year ago.  Graves ds: Reviewed history: Pt had a Thyroid Uptake and scan in 10/2012: Uptake 29%, at ULN, scan uniform, c/w Graves ds.  A thyroid U/S then showed a tiny nodule, no other defects.  We initially started him on methimazole 10 mg in a.m. and 5 mg in p.m. we were then able to taper the medication dose down and he was able to come off in 02/2016.  TFTs obtained afterwards remain normal.  Reviewed patient's TFTs: Lab Results  Component Value Date   TSH 0.96 06/19/2017   TSH 0.86 01/11/2017   TSH 0.72 06/19/2016   TSH 1.48 03/22/2016   TSH 1.10 11/30/2015   TSH 0.80 08/18/2015   TSH 0.48 07/01/2015   TSH 0.09 (L) 05/20/2015   TSH 0.92 12/30/2014   TSH 1.68 11/19/2014   FREET4 0.68 06/19/2017   FREET4 0.72 06/19/2016   FREET4 1.0 03/22/2016   FREET4 0.87 11/30/2015   FREET4 0.88 08/18/2015   FREET4 0.81 07/01/2015   FREET4 0.90 05/20/2015   FREET4 0.86 12/30/2014   FREET4 0.74 11/19/2014   FREET4 0.82 07/06/2014  TR Ab 2013 : 16.9% (<16)  Latest TSI level was undetectable: Lab Results  Component Value Date   TSI <89 06/19/2017   TSI 93 06/19/2016   Pt denies: - feeling nodules in neck - hoarseness - dysphagia - choking - SOB with lying down  Pt denies: - weight loss, in fact, he does complain of weight gain of approximately 8 pounds, which started after he went on a cruise in 01/2018 - heat intolerance - tremors - palpitations - anxiety - hyperdefecation - hair loss  He continues to have fatigue and almost falls asleep if he sits down.  He did not have a sleep study yet but is planning to schedule one.  He has postnasal drip and a nasonated voice.  He also has a history of BPH -sees [facility] urology, HL, psoriasis, leukopenia.  Vitamin B-12 deficiency:    Reviewed his B12 levels: Lab Results  Component Value Date   VITAMINB12 1,398 (H) 06/19/2017   VITAMINB12 459 06/19/2016   VITAMINB12 533 08/18/2015   VITAMINB12 171 (L) 10/08/2013   VITAMINB12 265 09/24/2006   He continues on B12 5000 mcg every other day, dose decreased from 5000 mcg daily at last visit.  He started Ginseng >> helps with fatigue. He is off his testosterone booster.  ROS: Constitutional: + See HPI Eyes: no blurry vision, no xerophthalmia ENT: no sore throat, + see HPI, + postnasal drip Cardiovascular: no CP/no SOB/no palpitations/no leg swelling Respiratory: no cough/no SOB/no wheezing Gastrointestinal: no N/no V/no D/no C/no acid reflux Musculoskeletal: no muscle aches/no joint aches Skin: no rashes, no hair loss Neurological: no tremors/no numbness/no tingling/no dizziness  I reviewed pt's medications, allergies, PMH, social hx, family hx, and changes were documented in the history of present illness. Otherwise, unchanged from my initial visit note.  Past Medical History:  Diagnosis Date  . Allergy   . BPH (benign prostatic hypertrophy)    on proscar and flomax (saw Dr. Terance Hart)  . Hx of colonic polyps 03/29/2018  . Perennial allergic rhinitis    eval by Dr. Redmond Baseman with ENT 2011  . Subclinical hyperthyroidism    Graves (upper normal thyroid uptake, mildly  elevated TSH R Ab)   Past Surgical History:  Procedure Laterality Date  . COLONOSCOPY  2009   WNL, rec rpt 10 yrs  . COLONOSCOPY  02/2018   3 benign polyps, rpt 5 yrs Carlean Purl)  . CYSTOSCOPY  03/28/2000   biopsy negative for ICS  . TONSILLECTOMY  as a child   Social History   Socioeconomic History  . Marital status: Married    Spouse name: Not on file  . Number of children: 2  . Years of education: Not on file  . Highest education level: Not on file  Occupational History  . Occupation: Financial risk analyst: [employer]  . Financial resource strain: Not on file   . Food insecurity:    Worry: Not on file    Inability: Not on file  . Transportation needs:    Medical: Not on file    Non-medical: Not on file  Tobacco Use  . Smoking status: Never Smoker  . Smokeless tobacco: Former Systems developer    Types: Snuff, Chew  . Tobacco comment: Occasional dip snuff 20 years ago  Substance and Sexual Activity  . Alcohol use: Yes    Alcohol/week: 4.0 standard drinks    Types: 4 Standard drinks or equivalent per week    Comment: couple of times a week  . Drug use: No  . Sexual activity: Yes  Lifestyle  . Physical activity:    Days per week: Not on file    Minutes per session: Not on file  . Stress: Not on file  Relationships  . Social connections:    Talks on phone: Not on file    Gets together: Not on file    Attends religious service: Not on file    Active member of club or organization: Not on file    Attends meetings of clubs or organizations: Not on file    Relationship status: Not on file  . Intimate partner violence:    Fear of current or ex partner: Not on file    Emotionally abused: Not on file    Physically abused: Not on file    Forced sexual activity: Not on file  Other Topics Concern  . Not on file  Social History Narrative   Caffeine: 3-4 cups coffee/day   Married and lives with wife and 2 sons   Occupation: Event organiser   Activity: walks several miles daily   Diet: good water, fruits/vegetables daily   Current Outpatient Medications on File Prior to Visit  Medication Sig Dispense Refill  . cyanocobalamin 1000 MCG tablet Take 1,000 mcg by mouth daily.    . finasteride (PROSCAR) 5 MG tablet Take 1 tablet (5 mg total) by mouth daily. NEEDS PHYSICAL EXAM 90 tablet 0  . loratadine (CLARITIN) 10 MG tablet Take 10 mg by mouth daily.      . Multiple Vitamin (MULTIVITAMIN) tablet Take 1 tablet by mouth daily.      . tamsulosin (FLOMAX) 0.4 MG CAPS capsule Take 1 capsule (0.4 mg total) by mouth daily. NEEDS PHYSICAL EXAM 90 capsule 0    Current Facility-Administered Medications on File Prior to Visit  Medication Dose Route Frequency Provider Last Rate Last Dose  . 0.9 %  sodium chloride infusion  500 mL Intravenous Continuous Gatha Mayer, MD       No Known Allergies Family History  Problem Relation Age of Onset  . Hyperlipidemia Mother   . Colon polyps Mother   . Thyroid  disease Mother        s/p iodine treatment  . Hyperlipidemia Father   . Hypertension Father   . Cancer Paternal Grandmother 67       colon  . Colon cancer Paternal Grandmother   . Stroke Maternal Grandmother   . Stroke Maternal Uncle   . CAD Paternal Grandfather        small MI  . Diabetes Neg Hx     PE: BP 140/90   Pulse 80   Ht 5\' 9"  (1.753 m)   Wt 187 lb (84.8 kg)   SpO2 95%   BMI 27.62 kg/m  Body mass index is 27.62 kg/m.   Wt Readings from Last 3 Encounters:  06/19/18 187 lb (84.8 kg)  03/21/18 182 lb 9.6 oz (82.8 kg)  03/07/18 182 lb 9.6 oz (82.8 kg)   Constitutional: Muscular, slightly overweight, in NAD Eyes: PERRLA, EOMI, no exophthalmos ENT: moist mucous membranes, no thyromegaly, L submandibular indurated enlarged lymph node Cardiovascular: RRR, No MRG Respiratory: CTA B Gastrointestinal: abdomen soft, NT, ND, BS+ Musculoskeletal: no deformities, strength intact in all 4 Skin: moist, warm, no rashes Neurological: no tremor with outstretched hands, DTR normal in all 4  ASSESSMENT: 1. Graves Ds.  2. B12 def  3.  Left upper neck lymphadenopathy  PLAN:  1. Patient with long history of Graves' disease, without thyrotoxic symptoms.  He was initially on methimazole, which we were able to taper to off and we stopped definitively in 02/2016.  Since then, his TFTs remained normal. - At last visit he did not complain of any symptoms except for fatigue, which was held by his B12 supplementation.  However, this was not completely resolved so we discussed about getting a sleep study.  At that time, he was also on a  plant-based testosterone booster which helped. His TFTs were normal then so we continued him off methimazole.  His TSI's were undetectable at that time. - At this visit, we will repeat his TFTs and we may need to restart a low-dose methimazole if they become abnormal. - I will see him back in a year  2. B12 def - We reviewed B12 vitamin level from last visit.  Since this was elevated, I advised him to decrease the dose of his daily supplement from 5000-2500 (taking the supplement every other day). - We will check another level today, however, he has been out of the medicine for 1 week  3.  Left upper neck lymphadenopathy -He has an indurated left upper neck enlarged  lymph node -We discussed that this may be related to his postnasal drip and possible sinusitis.  He does have some headaches. -At this visit he does have a nasonated voice -He is using Robitussin -I recommended Mucinex and also the use of a Nettie pot with distilled water to cleanse his sinuses  Office Visit on 06/19/2018  Component Date Value Ref Range Status  . TSH 06/19/2018 1.17  0.35 - 4.50 uIU/mL Final  . Free T4 06/19/2018 0.77  0.60 - 1.60 ng/dL Final   Comment: Specimens from patients who are undergoing biotin therapy and /or ingesting biotin supplements may contain high levels of biotin.  The higher biotin concentration in these specimens interferes with this Free T4 assay.  Specimens that contain high levels  of biotin may cause false high results for this Free T4 assay.  Please interpret results in light of the total clinical presentation of the patient.    . T3, Free 06/19/2018 3.6  2.3 - 4.2 pg/mL Final  . Vitamin B-12 06/19/2018 349  211 - 911 pg/mL Final   TFTs normal, but B12 lower than before.  Strongly advised him to restart the 2500 mcg B12 daily  Philemon Kingdom, MD PhD [facility] Endocrinology

## 2018-06-19 NOTE — Patient Instructions (Signed)
Please stop at the lab.  Please continue 5000 mcg of B12 every other day.  Please come back for a follow-up appointment in 1 year.

## 2018-08-05 DIAGNOSIS — D2261 Melanocytic nevi of right upper limb, including shoulder: Secondary | ICD-10-CM | POA: Diagnosis not present

## 2018-08-05 DIAGNOSIS — L814 Other melanin hyperpigmentation: Secondary | ICD-10-CM | POA: Diagnosis not present

## 2018-08-05 DIAGNOSIS — D2262 Melanocytic nevi of left upper limb, including shoulder: Secondary | ICD-10-CM | POA: Diagnosis not present

## 2018-10-20 ENCOUNTER — Other Ambulatory Visit: Payer: Self-pay | Admitting: Family Medicine

## 2018-10-20 DIAGNOSIS — E538 Deficiency of other specified B group vitamins: Secondary | ICD-10-CM

## 2018-10-20 DIAGNOSIS — N138 Other obstructive and reflux uropathy: Secondary | ICD-10-CM

## 2018-10-20 DIAGNOSIS — E0500 Thyrotoxicosis with diffuse goiter without thyrotoxic crisis or storm: Secondary | ICD-10-CM

## 2018-10-20 DIAGNOSIS — N401 Enlarged prostate with lower urinary tract symptoms: Secondary | ICD-10-CM

## 2018-10-20 DIAGNOSIS — E785 Hyperlipidemia, unspecified: Secondary | ICD-10-CM

## 2018-10-24 ENCOUNTER — Other Ambulatory Visit (INDEPENDENT_AMBULATORY_CARE_PROVIDER_SITE_OTHER): Payer: 59

## 2018-10-24 DIAGNOSIS — E538 Deficiency of other specified B group vitamins: Secondary | ICD-10-CM

## 2018-10-24 DIAGNOSIS — N401 Enlarged prostate with lower urinary tract symptoms: Secondary | ICD-10-CM

## 2018-10-24 DIAGNOSIS — E0500 Thyrotoxicosis with diffuse goiter without thyrotoxic crisis or storm: Secondary | ICD-10-CM | POA: Diagnosis not present

## 2018-10-24 DIAGNOSIS — E785 Hyperlipidemia, unspecified: Secondary | ICD-10-CM

## 2018-10-24 DIAGNOSIS — N138 Other obstructive and reflux uropathy: Secondary | ICD-10-CM

## 2018-10-24 LAB — COMPREHENSIVE METABOLIC PANEL
ALT: 22 U/L (ref 0–53)
AST: 18 U/L (ref 0–37)
Albumin: 4.7 g/dL (ref 3.5–5.2)
Alkaline Phosphatase: 50 U/L (ref 39–117)
BUN: 13 mg/dL (ref 6–23)
CO2: 28 mEq/L (ref 19–32)
Calcium: 9.7 mg/dL (ref 8.4–10.5)
Chloride: 103 mEq/L (ref 96–112)
Creatinine, Ser: 1.23 mg/dL (ref 0.40–1.50)
GFR: 65.98 mL/min (ref >60.00)
Glucose, Bld: 96 mg/dL (ref 70–99)
Potassium: 4.5 mEq/L (ref 3.5–5.1)
Sodium: 140 mEq/L (ref 135–145)
Total Bilirubin: 0.7 mg/dL (ref 0.2–1.2)
Total Protein: 7 g/dL (ref 6.0–8.3)

## 2018-10-24 LAB — LIPID PANEL
Cholesterol: 177 mg/dL (ref 0–200)
HDL: 50.2 mg/dL (ref >39.00)
LDL Cholesterol: 115 mg/dL — ABNORMAL HIGH (ref 0–99)
NonHDL: 126.78
TRIGLYCERIDES: 60 mg/dL (ref 0.0–149.0)
Total CHOL/HDL Ratio: 4
VLDL: 12 mg/dL (ref 0.0–40.0)

## 2018-10-24 LAB — TSH: TSH: 0.9 u[IU]/mL (ref 0.35–4.50)

## 2018-10-24 LAB — VITAMIN B12: Vitamin B-12: 598 pg/mL (ref 211–911)

## 2018-10-24 LAB — T4, FREE: Free T4: 0.82 ng/dL (ref 0.60–1.60)

## 2018-10-24 LAB — PSA: PSA: 0.17 ng/mL (ref 0.10–4.00)

## 2018-10-25 LAB — T3: T3, Total: 127 ng/dL (ref 76–181)

## 2018-10-31 ENCOUNTER — Ambulatory Visit (INDEPENDENT_AMBULATORY_CARE_PROVIDER_SITE_OTHER): Payer: 59 | Admitting: Family Medicine

## 2018-10-31 ENCOUNTER — Encounter: Payer: Self-pay | Admitting: Family Medicine

## 2018-10-31 VITALS — BP 124/84 | HR 71 | Temp 97.6°F | Ht 68.0 in | Wt 177.2 lb

## 2018-10-31 DIAGNOSIS — Z0000 Encounter for general adult medical examination without abnormal findings: Secondary | ICD-10-CM

## 2018-10-31 DIAGNOSIS — E538 Deficiency of other specified B group vitamins: Secondary | ICD-10-CM | POA: Diagnosis not present

## 2018-10-31 DIAGNOSIS — N138 Other obstructive and reflux uropathy: Secondary | ICD-10-CM

## 2018-10-31 DIAGNOSIS — N401 Enlarged prostate with lower urinary tract symptoms: Secondary | ICD-10-CM

## 2018-10-31 DIAGNOSIS — E0500 Thyrotoxicosis with diffuse goiter without thyrotoxic crisis or storm: Secondary | ICD-10-CM

## 2018-10-31 DIAGNOSIS — E785 Hyperlipidemia, unspecified: Secondary | ICD-10-CM

## 2018-10-31 DIAGNOSIS — N529 Male erectile dysfunction, unspecified: Secondary | ICD-10-CM

## 2018-10-31 MED ORDER — FINASTERIDE 5 MG PO TABS: 5.0000 mg | ORAL_TABLET | Freq: Every day | ORAL | 3 refills | Status: DC

## 2018-10-31 MED ORDER — TAMSULOSIN HCL 0.4 MG PO CAPS: 0.4000 mg | ORAL_CAPSULE | Freq: Every day | ORAL | 3 refills | Status: DC

## 2018-10-31 MED ORDER — TADALAFIL 2.5 MG PO TABS: 1.0000 | ORAL_TABLET | Freq: Every day | ORAL | 3 refills | Status: DC

## 2018-10-31 NOTE — Assessment & Plan Note (Addendum)
Reviewed treatment options - will price out once daily cialis 2.5mg  in h/o BPH. Pt aware cannot take nitroglycerin if on PDE5 inhibitor.

## 2018-10-31 NOTE — Assessment & Plan Note (Signed)
Preventative protocols reviewed and updated unless pt declined. Discussed healthy diet and lifestyle.  

## 2018-10-31 NOTE — Assessment & Plan Note (Signed)
Stable on b12 daily.

## 2018-10-31 NOTE — Assessment & Plan Note (Signed)
Chronic, stable off medication. Followed by Dr Cruzita Lederer.

## 2018-10-31 NOTE — Assessment & Plan Note (Signed)
DRE reassuring. Chronic, stable. Continue finasteride. Will try taper off flomax as we start cialis 2.5mg  daily see below.  IPSS score of 3/1

## 2018-10-31 NOTE — Progress Notes (Signed)
BP 124/84 (BP Location: Left Arm, Patient Position: Sitting, Cuff Size: Normal)   Pulse 71   Temp 97.6 F (36.4 C) (Oral)   Ht 5\' 8"  (1.727 m)   Wt 177 lb 4 oz (80.4 kg)   SpO2 98%   BMI 26.95 kg/m    CC: CPE Subjective:    Patient ID: William Tucker, male    DOB: [birth year], 53 y.o.   MRN: [id number]  HPI: William Tucker is a 53 y.o. male presenting on 10/31/2018 for Annual Exam   Graves disease - followed by endo Dr Cruzita Lederer. Stable off meds at this time.  BPH - longstanding on flomax and finasteride. Notes trouble starting stream. No significant nocturia.  Notes some ED - trouble maintaining erection.   Preventative: COLONOSCOPY 02/2018 - 3 benign polyps, rpt 5 yrs Carlean Purl) Prostate - gets checked yearly 2/2 alpha blocker and 5a reductase inhibitor for BPH.  Flu shot - declines  Tetanus - 2011  Seat belt use discussed Sunscreen use discussed. No changing moles on skin. Sees dermatologist yearly. Non smoker Alcohol - fully stopped fall 2019. Never drank significantly.  Dentist q6 mo Eye exam yearly   Caffeine: 3-4 cups coffee/day  Married and lives with wife and 2 sons  Occupation: Event organiser  Activity: walks several miles daily  Diet: good water, fruits/vegetables daily      Relevant past medical, surgical, family and social history reviewed and updated as indicated. Interim medical history since our last visit reviewed. Allergies and medications reviewed and updated. Outpatient Medications Prior to Visit  Medication Sig Dispense Refill  . cyanocobalamin 1000 MCG tablet Take 1,000 mcg by mouth daily.    Marland Kitchen loratadine (CLARITIN) 10 MG tablet Take 10 mg by mouth daily.      . Multiple Vitamin (MULTIVITAMIN) tablet Take 1 tablet by mouth daily.      . finasteride (PROSCAR) 5 MG tablet Take 1 tablet (5 mg total) by mouth daily. NEEDS PHYSICAL EXAM 90 tablet 0  . tamsulosin (FLOMAX) 0.4 MG CAPS capsule Take 1 capsule (0.4 mg total) by mouth daily. NEEDS  PHYSICAL EXAM 90 capsule 0   Facility-Administered Medications Prior to Visit  Medication Dose Route Frequency Provider Last Rate Last Dose  . 0.9 %  sodium chloride infusion  500 mL Intravenous Continuous Gatha Mayer, MD         Per HPI unless specifically indicated in ROS section below Review of Systems  Constitutional: Negative for activity change, appetite change, chills, fatigue, fever and unexpected weight change.  HENT: Negative for hearing loss.   Eyes: Negative for visual disturbance.  Respiratory: Negative for cough, chest tightness, shortness of breath and wheezing.   Cardiovascular: Negative for chest pain, palpitations and leg swelling.  Gastrointestinal: Negative for abdominal distention, abdominal pain, blood in stool, constipation, diarrhea, nausea and vomiting.  Genitourinary: Negative for difficulty urinating and hematuria.  Musculoskeletal: Negative for arthralgias, myalgias and neck pain.  Skin: Negative for rash.  Neurological: Negative for dizziness, seizures, syncope and headaches.  Hematological: Negative for adenopathy. Does not bruise/bleed easily.  Psychiatric/Behavioral: Negative for dysphoric mood. The patient is not nervous/anxious.    Objective:    BP 124/84 (BP Location: Left Arm, Patient Position: Sitting, Cuff Size: Normal)   Pulse 71   Temp 97.6 F (36.4 C) (Oral)   Ht 5\' 8"  (1.727 m)   Wt 177 lb 4 oz (80.4 kg)   SpO2 98%   BMI 26.95 kg/m   Wt Readings from  Last 3 Encounters:  10/31/18 177 lb 4 oz (80.4 kg)  06/19/18 187 lb (84.8 kg)  03/21/18 182 lb 9.6 oz (82.8 kg)    Physical Exam Vitals signs and nursing note reviewed.  Constitutional:      General: He is not in acute distress.    Appearance: He is well-developed.  HENT:     Head: Normocephalic and atraumatic.     Right Ear: Hearing, tympanic membrane, ear canal and external ear normal.     Left Ear: Hearing, tympanic membrane, ear canal and external ear normal.     Nose: Nose  normal.     Mouth/Throat:     Pharynx: Uvula midline. No oropharyngeal exudate or posterior oropharyngeal erythema.  Eyes:     General: No scleral icterus.    Conjunctiva/sclera: Conjunctivae normal.     Pupils: Pupils are equal, round, and reactive to light.  Neck:     Musculoskeletal: Normal range of motion and neck supple.     Vascular: No carotid bruit.  Cardiovascular:     Rate and Rhythm: Normal rate and regular rhythm.     Pulses:          Radial pulses are 2+ on the right side and 2+ on the left side.     Heart sounds: Normal heart sounds. No murmur.  Pulmonary:     Effort: Pulmonary effort is normal. No respiratory distress.     Breath sounds: Normal breath sounds. No wheezing or rales.  Abdominal:     General: Bowel sounds are normal. There is no distension.     Palpations: Abdomen is soft. There is no mass.     Tenderness: There is no abdominal tenderness. There is no guarding or rebound.  Genitourinary:    Prostate: Enlarged (30gm). Not tender and no nodules present.     Rectum: Normal. No mass, tenderness, anal fissure, external hemorrhoid or internal hemorrhoid. Normal anal tone.  Musculoskeletal: Normal range of motion.  Lymphadenopathy:     Cervical: No cervical adenopathy.  Skin:    General: Skin is warm and dry.     Findings: No rash.  Neurological:     Mental Status: He is alert and oriented to person, place, and time.     Comments: CN grossly intact, station and gait intact  Psychiatric:        Behavior: Behavior normal.        Thought Content: Thought content normal.        Judgment: Judgment normal.       Results for orders placed or performed in visit on 10/24/18  PSA  Result Value Ref Range   PSA 0.17 0.10 - 4.00 ng/mL  T4, free  Result Value Ref Range   Free T4 0.82 0.60 - 1.60 ng/dL  T3  Result Value Ref Range   T3, Total 127 76 - 181 ng/dL  TSH  Result Value Ref Range   TSH 0.90 0.35 - 4.50 uIU/mL  Lipid panel  Result Value Ref Range     Cholesterol 177 0 - 200 mg/dL   Triglycerides 60.0 0.0 - 149.0 mg/dL   HDL 50.20 >39.00 mg/dL   VLDL 12.0 0.0 - 40.0 mg/dL   LDL Cholesterol 115 (H) 0 - 99 mg/dL   Total CHOL/HDL Ratio 4    NonHDL 126.78   Comprehensive metabolic panel  Result Value Ref Range   Sodium 140 135 - 145 mEq/L   Potassium 4.5 3.5 - 5.1 mEq/L   Chloride 103 96 -  112 mEq/L   CO2 28 19 - 32 mEq/L   Glucose, Bld 96 70 - 99 mg/dL   BUN 13 6 - 23 mg/dL   Creatinine, Ser 1.23 0.40 - 1.50 mg/dL   Total Bilirubin 0.7 0.2 - 1.2 mg/dL   Alkaline Phosphatase 50 39 - 117 U/L   AST 18 0 - 37 U/L   ALT 22 0 - 53 U/L   Total Protein 7.0 6.0 - 8.3 g/dL   Albumin 4.7 3.5 - 5.2 g/dL   Calcium 9.7 8.4 - 10.5 mg/dL   GFR 65.98 >60.00 mL/min  Vitamin B12  Result Value Ref Range   Vitamin B-12 598 211 - 911 pg/mL   Assessment & Plan:   Problem List Items Addressed This Visit    Vitamin B12 deficiency    Stable on b12 daily.       HLD (hyperlipidemia)    Chronic, stable off treatment. The 10-year ASCVD risk score Mikey Bussing DC Brooke Bonito., et al., 2013) is: 2.7%   Values used to calculate the score:     Age: 53 years     Sex: Male     Is Non-Hispanic African American: No     Diabetic: No     Tobacco smoker: No     Systolic Blood Pressure: 026 mmHg     Is BP treated: No     HDL Cholesterol: 50.2 mg/dL     Total Cholesterol: 177 mg/dL       Relevant Medications   Tadalafil (CIALIS) 2.5 MG TABS   Healthcare maintenance - Primary    Preventative protocols reviewed and updated unless pt declined. Discussed healthy diet and lifestyle.       Graves disease (Chronic)    Chronic, stable off medication. Followed by Dr Cruzita Lederer.      Erectile dysfunction    Reviewed treatment options - will price out once daily cialis 2.5mg  in h/o BPH. Pt aware cannot take nitroglycerin if on PDE5 inhibitor.       Benign prostatic hyperplasia with urinary obstruction    DRE reassuring. Chronic, stable. Continue finasteride. Will try  taper off flomax as we start cialis 2.5mg  daily see below.  IPSS score of 3/1      Relevant Medications   finasteride (PROSCAR) 5 MG tablet   tamsulosin (FLOMAX) 0.4 MG CAPS capsule       Meds ordered this encounter  Medications  . finasteride (PROSCAR) 5 MG tablet    Sig: Take 1 tablet (5 mg total) by mouth daily.    Dispense:  90 tablet    Refill:  3  . tamsulosin (FLOMAX) 0.4 MG CAPS capsule    Sig: Take 1 capsule (0.4 mg total) by mouth daily.    Dispense:  90 capsule    Refill:  3  . Tadalafil (CIALIS) 2.5 MG TABS    Sig: Take 1 tablet (2.5 mg total) by mouth daily.    Dispense:  30 each    Refill:  3    For BPH/ED   No orders of the defined types were placed in this encounter.   Follow up plan: Return in about 1 year (around 11/01/2019) for annual exam, prior fasting for blood work.  Ria Bush, MD

## 2018-10-31 NOTE — Patient Instructions (Addendum)
Try once daily cialis. Continue proscar, try tapering of flomax if able. Update me with effect  You are doing well today. If interested, check with pharmacy about new 2 shot shingles series (shingrix).  Return as needed or in 1 year for next physical.   Health Maintenance, Male A healthy lifestyle and preventive care is important for your health and wellness. Ask your health care provider about what schedule of regular examinations is right for you. What should I know about weight and diet? Eat a Healthy Diet  Eat plenty of vegetables, fruits, whole grains, low-fat dairy products, and lean protein.  Do not eat a lot of foods high in solid fats, added sugars, or salt.  Maintain a Healthy Weight Regular exercise can help you achieve or maintain a healthy weight. You should:  Do at least 150 minutes of exercise each week. The exercise should increase your heart rate and make you sweat (moderate-intensity exercise).  Do strength-training exercises at least twice a week. Watch Your Levels of Cholesterol and Blood Lipids  Have your blood tested for lipids and cholesterol every 5 years starting at 51 years of age. If you are at high risk for heart disease, you should start having your blood tested when you are 51 years old. You may need to have your cholesterol levels checked more often if: ? Your lipid or cholesterol levels are high. ? You are older than 51 years of age. ? You are at high risk for heart disease. What should I know about cancer screening? Many types of cancers can be detected early and may often be prevented. Lung Cancer  You should be screened every year for lung cancer if: ? You are a current smoker who has smoked for at least 30 years. ? You are a former smoker who has quit within the past 15 years.  Talk to your health care provider about your screening options, when you should start screening, and how often you should be screened. Colorectal Cancer  Routine  colorectal cancer screening usually begins at 51 years of age and should be repeated every 5-10 years until you are 51 years old. You may need to be screened more often if early forms of precancerous polyps or small growths are found. Your health care provider may recommend screening at an earlier age if you have risk factors for colon cancer.  Your health care provider may recommend using home test kits to check for hidden blood in the stool.  A small camera at the end of a tube can be used to examine your colon (sigmoidoscopy or colonoscopy). This checks for the earliest forms of colorectal cancer. Prostate and Testicular Cancer  Depending on your age and overall health, your health care provider may do certain tests to screen for prostate and testicular cancer.  Talk to your health care provider about any symptoms or concerns you have about testicular or prostate cancer. Skin Cancer  Check your skin from head to toe regularly.  Tell your health care provider about any new moles or changes in moles, especially if: ? There is a change in a mole's size, shape, or color. ? You have a mole that is larger than a pencil eraser.  Always use sunscreen. Apply sunscreen liberally and repeat throughout the day.  Protect yourself by wearing long sleeves, pants, a wide-brimmed hat, and sunglasses when outside. What should I know about heart disease, diabetes, and high blood pressure?  If you are 37-19 years of age, have your  blood pressure checked every 3-5 years. If you are 9 years of age or older, have your blood pressure checked every year. You should have your blood pressure measured twice-once when you are at a hospital or clinic, and once when you are not at a hospital or clinic. Record the average of the two measurements. To check your blood pressure when you are not at a hospital or clinic, you can use: ? An automated blood pressure machine at a pharmacy. ? A home blood pressure  monitor.  Talk to your health care provider about your target blood pressure.  If you are between 7-70 years old, ask your health care provider if you should take aspirin to prevent heart disease.  Have regular diabetes screenings by checking your fasting blood sugar level. ? If you are at a normal weight and have a low risk for diabetes, have this test once every three years after the age of 23. ? If you are overweight and have a high risk for diabetes, consider being tested at a younger age or more often.  A one-time screening for abdominal aortic aneurysm (AAA) by ultrasound is recommended for men aged 76-75 years who are current or former smokers. What should I know about preventing infection? Hepatitis B If you have a higher risk for hepatitis B, you should be screened for this virus. Talk with your health care provider to find out if you are at risk for hepatitis B infection. Hepatitis C Blood testing is recommended for:  Everyone born from [birth year] through 1963.  Anyone with known risk factors for hepatitis C. Sexually Transmitted Diseases (STDs)  You should be screened each year for STDs including gonorrhea and chlamydia if: ? You are sexually active and are younger than 51 years of age. ? You are older than 51 years of age and your health care provider tells you that you are at risk for this type of infection. ? Your sexual activity has changed since you were last screened and you are at an increased risk for chlamydia or gonorrhea. Ask your health care provider if you are at risk.  Talk with your health care provider about whether you are at high risk of being infected with HIV. Your health care provider may recommend a prescription medicine to help prevent HIV infection. What else can I do?  Schedule regular health, dental, and eye exams.  Stay current with your vaccines (immunizations).  Do not use any tobacco products, such as cigarettes, chewing tobacco, and e-cigarettes.  If you need help quitting, ask your health care provider.  Limit alcohol intake to no more than 2 drinks per day. One drink equals 12 ounces of beer, 5 ounces of wine, or 1 ounces of hard liquor.  Do not use street drugs.  Do not share needles.  Ask your health care provider for help if you need support or information about quitting drugs.  Tell your health care provider if you often feel depressed.  Tell your health care provider if you have ever been abused or do not feel safe at home. This information is not intended to replace advice given to you by your health care provider. Make sure you discuss any questions you have with your health care provider. Document Released: 04/06/2008 Document Revised: 06/07/2016 Document Reviewed: 07/13/2015 Elsevier Interactive Patient Education  2019 [employer].

## 2018-10-31 NOTE — Assessment & Plan Note (Signed)
Chronic, stable off treatment. The 10-year ASCVD risk score William Tucker., et al., 2013) is: 2.7%   Values used to calculate the score:     Age: 53 years     Sex: Male     Is Non-Hispanic African American: No     Diabetic: No     Tobacco smoker: No     Systolic Blood Pressure: 790 mmHg     Is BP treated: No     HDL Cholesterol: 50.2 mg/dL     Total Cholesterol: 177 mg/dL

## 2018-11-19 ENCOUNTER — Telehealth: Payer: Self-pay | Admitting: Family Medicine

## 2018-11-19 MED ORDER — TADALAFIL 2.5 MG PO TABS: 1.0000 | ORAL_TABLET | Freq: Every day | ORAL | 0 refills | Status: DC

## 2018-11-19 NOTE — Telephone Encounter (Signed)
Pt called office requesting the Tadalafil to start being sent over to [facility].

## 2018-11-19 NOTE — Telephone Encounter (Signed)
E-scribed refill to [facility].  Pt is aware.

## 2019-03-05 ENCOUNTER — Other Ambulatory Visit: Payer: Self-pay | Admitting: Family Medicine

## 2019-05-01 ENCOUNTER — Telehealth: Payer: Self-pay | Admitting: Family Medicine

## 2019-05-01 DIAGNOSIS — Z72820 Sleep deprivation: Secondary | ICD-10-CM

## 2019-05-01 NOTE — Telephone Encounter (Signed)
Referral placed.

## 2019-05-01 NOTE — Telephone Encounter (Signed)
Best number [phone] Pt called stating he would like to have a sleep study done.  He had a referral a couple years didn't make an appointment.  He has decided he wants to go now.  Can you put referral in or does pt need to have appointment to discuss

## 2019-05-05 ENCOUNTER — Telehealth: Payer: Self-pay | Admitting: Internal Medicine

## 2019-05-05 NOTE — Telephone Encounter (Signed)
Called patient for COVID-19 pre-screening for in office visit.  Have you recently traveled any where out of the local area in the last 2 weeks? [facility] returned last week  Have you been in close contact with a person diagnosed with COVID-19 or someone awaiting results within the last 2 weeks? no  Do you currently have any of the following symptoms? If so, when did they start? Cough     Diarrhea   Joint Pain Fever      Muscle Pain   Red eyes Shortness of breath   Abdominal pain  Vomiting Loss of smell    Rash    Sore Throat Headache    Weakness   Bruising or bleeding   Okay to proceed with visit.

## 2019-05-06 ENCOUNTER — Encounter: Payer: Self-pay | Admitting: Internal Medicine

## 2019-05-06 ENCOUNTER — Ambulatory Visit: Payer: 59 | Admitting: Internal Medicine

## 2019-05-06 ENCOUNTER — Other Ambulatory Visit: Payer: Self-pay

## 2019-05-06 VITALS — BP 130/82 | HR 88 | Temp 97.9°F | Ht 69.0 in | Wt 179.0 lb

## 2019-05-06 DIAGNOSIS — G4719 Other hypersomnia: Secondary | ICD-10-CM | POA: Diagnosis not present

## 2019-05-06 NOTE — Progress Notes (Signed)
[facility] Consultation      Assessment and Plan:  Excessive daytime sleepiness. - Symptoms and signs of obstructive sleep apnea. -We will send for sleep study, start on CPAP as indicated.   Date: 05/06/2019  MRN# [id number] William Tucker December 06, 1965    William Tucker is a 53 y.o. old male seen in consultation for chief complaint of:    Chief Complaint  Patient presents with  . Consult    Dr.Gutierrez-Sleep- wife says he snores alot, and has daytime sleepiness.     HPI:  William Tucker is a 53 y.o. old male seen for excessive daytime sleepiness.  Patient typically goes to bed between 10 PM and midnight, falls asleep quickly gets out of bed at 5 AM.  His Epworth score is elevated at 16 today. He notes that he has been dozing off at the computer at work or otherwise sitting still. His wife has been waking him up because of snoring. He went to another room but then his son complained about the noise there too.   Denies sleep walking, sleep paralysis, cataplexy, denies jaw pain, no TMJ, no dentures.  No one in family with OSA, he has never been tested. No significant weight gain in past 5 years.  Tonsillectomy as a child.     PMHX:   Past Medical History:  Diagnosis Date  . Allergy   . BPH (benign prostatic hypertrophy)    on proscar and flomax (saw Dr. Terance Hart)  . Hx of colonic polyps 03/29/2018  . Perennial allergic rhinitis    eval by Dr. Redmond Baseman with ENT 2011  . Subclinical hyperthyroidism    Graves (upper normal thyroid uptake, mildly elevated TSH R Ab)   Surgical Hx:  Past Surgical History:  Procedure Laterality Date  . COLONOSCOPY  2009   WNL, rec rpt 10 yrs  . COLONOSCOPY  02/2018   3 benign polyps, rpt 5 yrs Carlean Purl)  . CYSTOSCOPY  03/28/2000   biopsy negative for ICS  . TONSILLECTOMY  as a child   Family Hx:  Family History  Problem Relation Age of Onset  . Hyperlipidemia Mother   . Colon polyps Mother   . Thyroid disease  Mother        s/p iodine treatment  . Hyperlipidemia Father   . Hypertension Father   . Cancer Paternal Grandmother 50       colon  . Colon cancer Paternal Grandmother   . Stroke Maternal Grandmother   . Stroke Maternal Uncle   . CAD Paternal Grandfather        small MI  . Diabetes Neg Hx    Social Hx:   Social History   Tobacco Use  . Smoking status: Never Smoker  . Smokeless tobacco: Former Systems developer    Types: Snuff, Chew  . Tobacco comment: Occasional dip snuff 20 years ago  Substance Use Topics  . Alcohol use: Yes    Alcohol/week: 4.0 standard drinks    Types: 4 Standard drinks or equivalent per week    Comment: couple of times a week  . Drug use: No   Medication:    Current Outpatient Medications:  .  cyanocobalamin 1000 MCG tablet, Take 1,000 mcg by mouth daily., Disp: , Rfl:  .  finasteride (PROSCAR) 5 MG tablet, Take 1 tablet (5 mg total) by mouth daily., Disp: 90 tablet, Rfl: 3 .  loratadine (CLARITIN) 10 MG tablet, Take 10 mg by mouth daily.  , Disp: , Rfl:  .  tamsulosin (FLOMAX) 0.4 MG CAPS capsule, Take 1 capsule (0.4 mg total) by mouth daily., Disp: 90 capsule, Rfl: 3  Current Facility-Administered Medications:  .  0.9 %  sodium chloride infusion, 500 mL, Intravenous, Continuous, Gatha Mayer, MD   Allergies:  Patient has no known allergies.  Review of Systems: Gen:  Denies  fever, sweats, chills HEENT: Denies blurred vision, double vision. bleeds, sore throat Cvc:  No dizziness, chest pain. Resp:   Denies cough or sputum production, shortness of breath Gi: Denies swallowing difficulty, stomach pain. Gu:  Denies bladder incontinence, burning urine Ext:   No Joint pain, stiffness. Skin: No skin rash,  hives  Endoc:  No polyuria, polydipsia. Psych: No depression, insomnia. Other:  All other systems were reviewed with the patient and were negative other that what is mentioned in the HPI.   Physical Examination:   VS: BP 130/82 (BP Location: Left Arm,  Cuff Size: Normal)   Pulse 88   Temp 97.9 F (36.6 C) (Skin)   Ht 5\' 9"  (1.753 m)   Wt 179 lb (81.2 kg)   SpO2 96%   BMI 26.43 kg/m   General Appearance: No distress  Neuro:without focal findings,  speech normal,  HEENT: PERRLA, EOM intact.  Mallampati 3 Pulmonary: normal breath sounds, No wheezing.  CardiovascularNormal S1,S2.  No m/r/g.   Abdomen: Benign, Soft, non-tender. Renal:  No costovertebral tenderness  GU:  No performed at this time. Endoc: No evident thyromegaly, no signs of acromegaly. Skin:   warm, no rashes, no ecchymosis  Extremities: normal, no cyanosis, clubbing.  Other findings:    LABORATORY PANEL:   CBC No results for input(s): WBC, HGB, HCT, PLT in the last 168 hours. ------------------------------------------------------------------------------------------------------------------  Chemistries  No results for input(s): NA, K, CL, CO2, GLUCOSE, BUN, CREATININE, CALCIUM, MG, AST, ALT, ALKPHOS, BILITOT in the last 168 hours.  Invalid input(s): GFRCGP ------------------------------------------------------------------------------------------------------------------  Cardiac Enzymes No results for input(s): TROPONINI in the last 168 hours. ------------------------------------------------------------  RADIOLOGY:  No results found.     Thank  you for the consultation and for allowing [facility] to assist in the care of your patient. Our recommendations are noted above.  Please contact us if we can be of further service.   Marda Stalker, M.D., F.C.C.P.  Board Certified in Internal Medicine, Pulmonary Medicine, [place], and Sleep Medicine.   Pulmonary [facility] Critical Care Office Number: [phone]   05/06/2019

## 2019-05-06 NOTE — Patient Instructions (Signed)

## 2019-05-28 ENCOUNTER — Ambulatory Visit: Payer: 59

## 2019-05-28 DIAGNOSIS — G4719 Other hypersomnia: Secondary | ICD-10-CM

## 2019-05-28 DIAGNOSIS — G4733 Obstructive sleep apnea (adult) (pediatric): Secondary | ICD-10-CM | POA: Diagnosis not present

## 2019-06-02 ENCOUNTER — Telehealth: Payer: Self-pay | Admitting: Internal Medicine

## 2019-06-02 DIAGNOSIS — G4733 Obstructive sleep apnea (adult) (pediatric): Secondary | ICD-10-CM

## 2019-06-02 NOTE — Telephone Encounter (Signed)
HST performed on 05/28/2019 confirmed moderate OSA with AHI of 27. Recommend auto cpap 5-20cm h2O.  Pt is aware of results and wished to proceed with cpap.  Order has been placed.  Pt has been scheduled for OV on 07/23/2019. Nothing further is needed.

## 2019-06-20 ENCOUNTER — Other Ambulatory Visit: Payer: Self-pay

## 2019-06-24 ENCOUNTER — Encounter: Payer: Self-pay | Admitting: Internal Medicine

## 2019-06-24 ENCOUNTER — Other Ambulatory Visit: Payer: Self-pay

## 2019-06-24 ENCOUNTER — Ambulatory Visit: Payer: 59 | Admitting: Internal Medicine

## 2019-06-24 VITALS — BP 150/90 | HR 80 | Ht 69.0 in | Wt 172.0 lb

## 2019-06-24 DIAGNOSIS — E0500 Thyrotoxicosis with diffuse goiter without thyrotoxic crisis or storm: Secondary | ICD-10-CM | POA: Diagnosis not present

## 2019-06-24 DIAGNOSIS — E538 Deficiency of other specified B group vitamins: Secondary | ICD-10-CM

## 2019-06-24 DIAGNOSIS — R590 Localized enlarged lymph nodes: Secondary | ICD-10-CM | POA: Diagnosis not present

## 2019-06-24 LAB — T4, FREE: Free T4: 0.83 ng/dL (ref 0.60–1.60)

## 2019-06-24 LAB — TSH: TSH: 1.25 u[IU]/mL (ref 0.35–4.50)

## 2019-06-24 LAB — T3, FREE: T3, Free: 3.7 pg/mL (ref 2.3–4.2)

## 2019-06-24 NOTE — Patient Instructions (Signed)
Please stop at the lab.  Please continue 5000 mcg of B12 every other day.  Please come back for a follow-up appointment in 1 year.

## 2019-06-24 NOTE — Progress Notes (Signed)
Patient ID: William Tucker, male   DOB: 27-Apr-1966, 53 y.o.   MRN: [id number]   HPI  William Tucker is a 53 y.o.-year-old male, returning for f/u for Graves ds and B12 def. Last visit 1 year ago  Graves ds: Reviewed history: Pt had a Thyroid Uptake and scan in 10/2012: Uptake 29%, at ULN, scan uniform, c/w Graves ds.  A thyroid U/S then showed a tiny nodule, no other defects.  We initially started him on methimazole 10 mg in a.m. and 5 mg in p.m. we were then able to taper the medication dose down and he was able to, in 02/2016.  TFTs obtained afterwards remains normal.  Reviewed patient's TFTs: Lab Results  Component Value Date   TSH 0.90 10/24/2018   TSH 1.17 06/19/2018   TSH 0.96 06/19/2017   TSH 0.86 01/11/2017   TSH 0.72 06/19/2016   TSH 1.48 03/22/2016   TSH 1.10 11/30/2015   TSH 0.80 08/18/2015   TSH 0.48 07/01/2015   TSH 0.09 (L) 05/20/2015   FREET4 0.82 10/24/2018   FREET4 0.77 06/19/2018   FREET4 0.68 06/19/2017   FREET4 0.72 06/19/2016   FREET4 1.0 03/22/2016   FREET4 0.87 11/30/2015   FREET4 0.88 08/18/2015   FREET4 0.81 07/01/2015   FREET4 0.90 05/20/2015   FREET4 0.86 12/30/2014  TR Ab 2013 : 16.9% (<16)  Latest TSI level was undetectable: Lab Results  Component Value Date   TSI <89 06/19/2017   TSI 93 06/19/2016   Pt denies: - feeling nodules in neck - hoarseness - dysphagia - choking - SOB with lying down  Pt denies: - weight loss - heat intolerance - tremors - palpitations - anxiety - hyperdefecation - hair loss  He continues to experience fatigue.  He had a sleep study >> OSA >> started on a CPAP.  He continues to have postnasal drip and nasonated voice.  He also has a history of BPH -sees [facility] urology, HL, psoriasis, leukopenia.  Vitamin B-12 deficiency:   Reviewed his B12 levels: Lab Results  Component Value Date   VITAMINB12 598 10/24/2018   VITAMINB12 349 06/19/2018   VITAMINB12 1,398 (H) 06/19/2017   VITAMINB12  459 06/19/2016   VITAMINB12 533 08/18/2015   VITAMINB12 171 (L) 10/08/2013   VITAMINB12 265 09/24/2006   He is on 2500 mcg B12 daily, but ran out 1 mo ago.  Before last visit he started ginseng >> helps with fatigue.  He was previously on a testosterone booster.  ROS: Constitutional: no weight gain/no weight loss, no fatigue, no subjective hyperthermia, no subjective hypothermia Eyes: no blurry vision, no xerophthalmia ENT: no sore throat, + see HPI Cardiovascular: no CP/no SOB/no palpitations/no leg swelling Respiratory: no cough/no SOB/no wheezing Gastrointestinal: no N/no V/no D/no C/no acid reflux Musculoskeletal: no muscle aches/no joint aches Skin: no rashes, no hair loss Neurological: no tremors/no numbness/no tingling/no dizziness  I reviewed pt's medications, allergies, PMH, social hx, family hx, and changes were documented in the history of present illness. Otherwise, unchanged from my initial visit note.  Past Medical History:  Diagnosis Date  . Allergy   . BPH (benign prostatic hypertrophy)    on proscar and flomax (saw Dr. Terance Hart)  . Hx of colonic polyps 03/29/2018  . Perennial allergic rhinitis    eval by Dr. Redmond Baseman with ENT 2011  . Subclinical hyperthyroidism    Graves (upper normal thyroid uptake, mildly elevated TSH R Ab)   Past Surgical History:  Procedure Laterality Date  . COLONOSCOPY  2009   WNL, rec rpt 10 yrs  . COLONOSCOPY  02/2018   3 benign polyps, rpt 5 yrs Carlean Purl)  . CYSTOSCOPY  03/28/2000   biopsy negative for ICS  . TONSILLECTOMY  as a child   Social History   Socioeconomic History  . Marital status: Married    Spouse name: Not on file  . Number of children: 2  . Years of education: Not on file  . Highest education level: Not on file  Occupational History  . Occupation: Financial risk analyst: [employer]  . Financial resource strain: Not on file  . Food insecurity    Worry: Not on file    Inability: Not  on file  . Transportation needs    Medical: Not on file    Non-medical: Not on file  Tobacco Use  . Smoking status: Never Smoker  . Smokeless tobacco: Former Systems developer    Types: Snuff, Chew  . Tobacco comment: Occasional dip snuff 20 years ago  Substance and Sexual Activity  . Alcohol use: Yes    Alcohol/week: 4.0 standard drinks    Types: 4 Standard drinks or equivalent per week    Comment: couple of times a week  . Drug use: No  . Sexual activity: Yes  Lifestyle  . Physical activity    Days per week: Not on file    Minutes per session: Not on file  . Stress: Not on file  Relationships  . Social Herbalist on phone: Not on file    Gets together: Not on file    Attends religious service: Not on file    Active member of club or organization: Not on file    Attends meetings of clubs or organizations: Not on file    Relationship status: Not on file  . Intimate partner violence    Fear of current or ex partner: Not on file    Emotionally abused: Not on file    Physically abused: Not on file    Forced sexual activity: Not on file  Other Topics Concern  . Not on file  Social History Narrative   Caffeine: 3-4 cups coffee/day   Married and lives with wife and 2 sons   Occupation: Event organiser   Activity: walks several miles daily   Diet: good water, fruits/vegetables daily   Current Outpatient Medications on File Prior to Visit  Medication Sig Dispense Refill  . cyanocobalamin 1000 MCG tablet Take 1,000 mcg by mouth daily.    . finasteride (PROSCAR) 5 MG tablet Take 1 tablet (5 mg total) by mouth daily. 90 tablet 3  . loratadine (CLARITIN) 10 MG tablet Take 10 mg by mouth daily.      . tamsulosin (FLOMAX) 0.4 MG CAPS capsule Take 1 capsule (0.4 mg total) by mouth daily. 90 capsule 3   Current Facility-Administered Medications on File Prior to Visit  Medication Dose Route Frequency Provider Last Rate Last Dose  . 0.9 %  sodium chloride infusion  500 mL Intravenous  Continuous Gatha Mayer, MD       No Known Allergies Family History  Problem Relation Age of Onset  . Hyperlipidemia Mother   . Colon polyps Mother   . Thyroid disease Mother        s/p iodine treatment  . Hyperlipidemia Father   . Hypertension Father   . Cancer Paternal Grandmother 60       colon  . Colon cancer  Paternal Grandmother   . Stroke Maternal Grandmother   . Stroke Maternal Uncle   . CAD Paternal Grandfather        small MI  . Diabetes Neg Hx     PE: BP (!) 150/90   Pulse 80   Ht 5\' 9"  (1.753 m)   Wt 172 lb (78 kg)   SpO2 98%   BMI 25.40 kg/m  Body mass index is 25.4 kg/m.   Wt Readings from Last 3 Encounters:  06/24/19 172 lb (78 kg)  05/06/19 179 lb (81.2 kg)  10/31/18 177 lb 4 oz (80.4 kg)   Constitutional: Normal weight, in NAD Eyes: PERRLA, EOMI, no exophthalmos ENT: moist mucous membranes, no thyromegaly, no palpable cervical LAD  Cardiovascular: RRR, No MRG Respiratory: CTA B Gastrointestinal: abdomen soft, NT, ND, BS+ Musculoskeletal: no deformities, strength intact in all 4 Skin: moist, warm, no rashes Neurological: no tremor with outstretched hands, DTR normal in all 4  ASSESSMENT: 1. Graves Ds.  2. B12 def  3.  Left upper neck lymphadenopathy  PLAN:  1. Patient with long history of Graves' disease, without thyrotoxic symptoms.  He was initially on methimazole which we were able to taper to off and stopped definitively in 02/2016.  TFTs remained normal afterwards including latest TFTs from 10/2018 and these were normal -Latest TSI level was undetectable -At this visit, we will repeat his TFTs and we may need to restart the low-dose methimazole if abnormal -No signs of Graves' ophthalmopathy -I will see him back in 1 year, however, we discussed about thyrotoxic symptoms and advised him to let me know if he starts to experience them before our next visit  2. B12 def -Denies numbness/tingling.  He has fatigue but possibly related to  OSA.  He started on a CPAP >> trying to get used to it. -Patient has a history of B12 deficiency after which we started supplementation.  -Reviewed B12 level from last visit, which was lower than before so we discussed about restarting 2500 mcg p.o. B12 daily (at that time he was off for 1 week). Latest level from 10/2018 was very good. Now again off x 1 mo >> will restart. -We will recheck at next visit  3.  Left upper neck lymphadenopathy -He had an indurated left upper neck enlarged lymph node at last visits -We discussed at last visit that this may be related to his postnasal drip and possible sinusitis-I recommended Mucinex and also the use of a Nettie pot with distilled water to cleanse his sinuses -at this visit, this is not palpable anymore  Office Visit on 06/24/2019  Component Date Value Ref Range Status  . TSH 06/24/2019 1.25  0.35 - 4.50 uIU/mL Final  . Free T4 06/24/2019 0.83  0.60 - 1.60 ng/dL Final   Comment: Specimens from patients who are undergoing biotin therapy and /or ingesting biotin supplements may contain high levels of biotin.  The higher biotin concentration in these specimens interferes with this Free T4 assay.  Specimens that contain high levels  of biotin may cause false high results for this Free T4 assay.  Please interpret results in light of the total clinical presentation of the patient.    . T3, Free 06/24/2019 3.7  2.3 - 4.2 pg/mL Final   All labs are normal.  Philemon Kingdom, MD PhD [facility] Endocrinology

## 2019-07-23 ENCOUNTER — Ambulatory Visit: Payer: 59 | Admitting: Internal Medicine

## 2019-08-04 ENCOUNTER — Other Ambulatory Visit: Payer: Self-pay

## 2019-08-04 ENCOUNTER — Ambulatory Visit (INDEPENDENT_AMBULATORY_CARE_PROVIDER_SITE_OTHER): Payer: 59 | Admitting: Internal Medicine

## 2019-08-04 ENCOUNTER — Encounter: Payer: Self-pay | Admitting: Internal Medicine

## 2019-08-04 DIAGNOSIS — G4733 Obstructive sleep apnea (adult) (pediatric): Secondary | ICD-10-CM

## 2019-08-04 NOTE — Progress Notes (Signed)
[facility] Consultation      I connected with the patient by telephone enabled telemedicine visit and verified that I am speaking with the correct person using two identifiers.    I discussed the limitations, risks, security and privacy concerns of performing an evaluation and management service by telemedicine and the availability of in-person appointments. I also discussed with the patient that there may be a patient responsible charge related to this service. The patient expressed understanding and agreed to proceed.  PATIENT AGREES AND CONFIRMS -YES   Other persons participating in the visit and their role in the encounter: Patient, nursing  This visit type was conducted due to national recommendations for restrictions regarding the COVID-19 Pandemic (e.g. social distancing).  This format is felt to be most appropriate for this patient at this time.  All issues noted in this document were discussed and addressed.        Date: 08/04/2019  MRN# [id number] William Tucker Jan 16, 1966  William Tucker is a 53 y.o. old male seen in consultation for chief complaint of:    SYNOPSIS William Tucker is a 53 y.o. old male seen for excessive daytime sleepiness.  Patient typically goes to bed between 10 PM and midnight, falls asleep quickly gets out of bed at 5 AM.  His Epworth score is elevated at 16 today. He notes that he has been dozing off at the computer at work or otherwise sitting still. His wife has been waking him up because of snoring. He went to another room but then his son complained about the noise there too.   Denies sleep walking, sleep paralysis, cataplexy, denies jaw pain, no TMJ, no dentures.  No one in family with OSA, he has never been tested. No significant weight gain in past 5 years.  Tonsillectomy as a child.   **04/2019 Home Sleep Study AHI 27   CC follow up Severe OSA  HPI:  SLEEP STUDY SHOWS AHI 27 155 desats episodes  First 3 weeks  not good usage Uses full face mask But he has gotten more use to it   AHI down 7 More energy during day Less sleepy during day more active feels much better  No evidence of heart failure at this time No evidence or signs of infection at this time No respiratory distress No fevers, chills, nausea, vomiting, diarrhea No evidence of lower extremity edema No evidence hemoptysis      PMHX:   Past Medical History:  Diagnosis Date  . Allergy   . BPH (benign prostatic hypertrophy)    on proscar and flomax (saw Dr. Terance Hart)  . Hx of colonic polyps 03/29/2018  . Perennial allergic rhinitis    eval by Dr. Redmond Baseman with ENT 2011  . Subclinical hyperthyroidism    Graves (upper normal thyroid uptake, mildly elevated TSH R Ab)   Surgical Hx:  Past Surgical History:  Procedure Laterality Date  . COLONOSCOPY  2009   WNL, rec rpt 10 yrs  . COLONOSCOPY  02/2018   3 benign polyps, rpt 5 yrs Carlean Purl)  . CYSTOSCOPY  03/28/2000   biopsy negative for ICS  . TONSILLECTOMY  as a child   Family Hx:  Family History  Problem Relation Age of Onset  . Hyperlipidemia Mother   . Colon polyps Mother   . Thyroid disease Mother        s/p iodine treatment  . Hyperlipidemia Father   . Hypertension Father   . Cancer Paternal Grandmother 77  colon  . Colon cancer Paternal Grandmother   . Stroke Maternal Grandmother   . Stroke Maternal Uncle   . CAD Paternal Grandfather        small MI  . Diabetes Neg Hx    Social Hx:   Social History   Tobacco Use  . Smoking status: Never Smoker  . Smokeless tobacco: Former Systems developer    Types: Snuff, Chew  . Tobacco comment: Occasional dip snuff 20 years ago  Substance Use Topics  . Alcohol use: Yes    Alcohol/week: 4.0 standard drinks    Types: 4 Standard drinks or equivalent per week    Comment: couple of times a week  . Drug use: No   Medication:    Current Outpatient Medications:  .  cyanocobalamin 1000 MCG tablet, Take 1,000 mcg by mouth  daily., Disp: , Rfl:  .  finasteride (PROSCAR) 5 MG tablet, Take 1 tablet (5 mg total) by mouth daily., Disp: 90 tablet, Rfl: 3 .  loratadine (CLARITIN) 10 MG tablet, Take 10 mg by mouth daily.  , Disp: , Rfl:  .  tamsulosin (FLOMAX) 0.4 MG CAPS capsule, Take 1 capsule (0.4 mg total) by mouth daily., Disp: 90 capsule, Rfl: 3  Current Facility-Administered Medications:  .  0.9 %  sodium chloride infusion, 500 mL, Intravenous, Continuous, Gatha Mayer, MD   Allergies:  Patient has no known allergies.   Review of Systems:  Gen:  Denies  fever, sweats, chills weight loss  HEENT: Denies blurred vision, double vision, ear pain, eye pain, hearing loss, nose bleeds, sore throat Cardiac:  No dizziness, chest pain or heaviness, chest tightness,edema, No JVD Resp:   No cough, -sputum production, -shortness of breath,-wheezing, -hemoptysis,  Gi: Denies swallowing difficulty, stomach pain, nausea or vomiting, diarrhea, constipation, bowel incontinence Gu:  Denies bladder incontinence, burning urine Ext:   Denies Joint pain, stiffness or swelling Skin: Denies  skin rash, easy bruising or bleeding or hives Endoc:  Denies polyuria, polydipsia , polyphagia or weight change Psych:   Denies depression, insomnia or hallucinations  Other:  All other systems negative   ASSESSMENT AND PLAN  SEVERE OSA AHI 27 Findings reviewed with patient, AHI down to 7 Symptoms much improved Continue AUTOCPAP as prescribed  COVID-19 EDUCATION: The signs and symptoms of COVID-19 were discussed with the patient and how to seek care for testing.  The importance of social distancing was discussed today. Hand Washing Techniques and avoid touching face was advised.     MEDICATION ADJUSTMENTS/LABS AND TESTS ORDERED: Compliance report at next [place]   Patient satisfied with Plan of action and management. All questions answered  Follow up in 6 months   TOTAL TIME SPENT 22 mins  Maretta Bees Patricia Pesa, M.D.  [facility]  Medical Director [place] Director [facility] Cardio-Pulmonary Department

## 2019-08-04 NOTE — Patient Instructions (Addendum)
compliacne report at next OV

## 2019-09-27 ENCOUNTER — Other Ambulatory Visit: Payer: Self-pay | Admitting: Family Medicine

## 2019-11-30 ENCOUNTER — Encounter: Payer: Self-pay | Admitting: Family Medicine

## 2019-12-04 ENCOUNTER — Encounter: Payer: Self-pay | Admitting: Family Medicine

## 2019-12-04 ENCOUNTER — Telehealth: Payer: Self-pay | Admitting: Family Medicine

## 2019-12-04 NOTE — Telephone Encounter (Signed)
Noted  

## 2019-12-04 NOTE — Telephone Encounter (Signed)
E-scribed refills.  Plz schedule cpe and lab visits.  

## 2019-12-04 NOTE — Telephone Encounter (Signed)
Just called William Tucker and his wife said he was not available to talk at the moment but she will have him call back to set up his CPE and labs as soon as possible.

## 2019-12-12 ENCOUNTER — Ambulatory Visit: Payer: 59

## 2019-12-12 ENCOUNTER — Telehealth: Payer: Self-pay | Admitting: Internal Medicine

## 2019-12-12 NOTE — Telephone Encounter (Signed)
Patient called asking and was wondering if we could tell him what blood type he is. Requests to have it in mychart.  Ph# [phone]

## 2020-01-19 ENCOUNTER — Other Ambulatory Visit (INDEPENDENT_AMBULATORY_CARE_PROVIDER_SITE_OTHER): Payer: 59

## 2020-01-19 ENCOUNTER — Other Ambulatory Visit: Payer: Self-pay

## 2020-01-19 ENCOUNTER — Other Ambulatory Visit: Payer: Self-pay | Admitting: Family Medicine

## 2020-01-19 DIAGNOSIS — E785 Hyperlipidemia, unspecified: Secondary | ICD-10-CM

## 2020-01-19 DIAGNOSIS — N138 Other obstructive and reflux uropathy: Secondary | ICD-10-CM | POA: Diagnosis not present

## 2020-01-19 DIAGNOSIS — E0500 Thyrotoxicosis with diffuse goiter without thyrotoxic crisis or storm: Secondary | ICD-10-CM | POA: Diagnosis not present

## 2020-01-19 DIAGNOSIS — E538 Deficiency of other specified B group vitamins: Secondary | ICD-10-CM

## 2020-01-19 DIAGNOSIS — N401 Enlarged prostate with lower urinary tract symptoms: Secondary | ICD-10-CM | POA: Diagnosis not present

## 2020-01-19 LAB — TSH: TSH: 1.36 u[IU]/mL (ref 0.35–4.50)

## 2020-01-19 LAB — LIPID PANEL
Cholesterol: 181 mg/dL (ref 0–200)
HDL: 50.8 mg/dL (ref >39.00)
LDL Cholesterol: 117 mg/dL — ABNORMAL HIGH (ref 0–99)
NonHDL: 130.39
Total CHOL/HDL Ratio: 4
Triglycerides: 66 mg/dL (ref 0.0–149.0)
VLDL: 13.2 mg/dL (ref 0.0–40.0)

## 2020-01-19 LAB — COMPREHENSIVE METABOLIC PANEL
ALT: 15 U/L (ref 0–53)
AST: 13 U/L (ref 0–37)
Albumin: 4.5 g/dL (ref 3.5–5.2)
Alkaline Phosphatase: 42 U/L (ref 39–117)
BUN: 15 mg/dL (ref 6–23)
CO2: 29 mEq/L (ref 19–32)
Calcium: 9.4 mg/dL (ref 8.4–10.5)
Chloride: 104 mEq/L (ref 96–112)
Creatinine, Ser: 1.21 mg/dL (ref 0.40–1.50)
GFR: 62.96 mL/min (ref >60.00)
Glucose, Bld: 108 mg/dL — ABNORMAL HIGH (ref 70–99)
Potassium: 4.3 mEq/L (ref 3.5–5.1)
Sodium: 139 mEq/L (ref 135–145)
Total Bilirubin: 0.6 mg/dL (ref 0.2–1.2)
Total Protein: 6.6 g/dL (ref 6.0–8.3)

## 2020-01-19 LAB — T4, FREE: Free T4: 0.71 ng/dL (ref 0.60–1.60)

## 2020-01-19 LAB — VITAMIN B12: Vitamin B-12: 226 pg/mL (ref 211–911)

## 2020-01-19 LAB — PSA: PSA: 0.1 ng/mL (ref 0.10–4.00)

## 2020-01-26 ENCOUNTER — Encounter: Payer: Self-pay | Admitting: Family Medicine

## 2020-01-26 ENCOUNTER — Other Ambulatory Visit: Payer: Self-pay

## 2020-01-26 ENCOUNTER — Ambulatory Visit (INDEPENDENT_AMBULATORY_CARE_PROVIDER_SITE_OTHER): Payer: 59 | Admitting: Family Medicine

## 2020-01-26 VITALS — BP 134/86 | HR 74 | Temp 97.9°F | Ht 67.75 in | Wt 180.1 lb

## 2020-01-26 DIAGNOSIS — E785 Hyperlipidemia, unspecified: Secondary | ICD-10-CM

## 2020-01-26 DIAGNOSIS — E538 Deficiency of other specified B group vitamins: Secondary | ICD-10-CM | POA: Diagnosis not present

## 2020-01-26 DIAGNOSIS — N529 Male erectile dysfunction, unspecified: Secondary | ICD-10-CM

## 2020-01-26 DIAGNOSIS — Z0000 Encounter for general adult medical examination without abnormal findings: Secondary | ICD-10-CM

## 2020-01-26 DIAGNOSIS — Z23 Encounter for immunization: Secondary | ICD-10-CM

## 2020-01-26 DIAGNOSIS — G4733 Obstructive sleep apnea (adult) (pediatric): Secondary | ICD-10-CM | POA: Diagnosis not present

## 2020-01-26 DIAGNOSIS — E0500 Thyrotoxicosis with diffuse goiter without thyrotoxic crisis or storm: Secondary | ICD-10-CM | POA: Diagnosis not present

## 2020-01-26 DIAGNOSIS — N401 Enlarged prostate with lower urinary tract symptoms: Secondary | ICD-10-CM

## 2020-01-26 DIAGNOSIS — N138 Other obstructive and reflux uropathy: Secondary | ICD-10-CM

## 2020-01-26 DIAGNOSIS — Z9989 Dependence on other enabling machines and devices: Secondary | ICD-10-CM

## 2020-01-26 MED ORDER — CYANOCOBALAMIN 1000 MCG/ML IJ SOLN
1000.0000 ug | Freq: Once | INTRAMUSCULAR | Status: AC
  Administered 2020-01-26: 1000 ug via INTRAMUSCULAR

## 2020-01-26 MED ORDER — SILDENAFIL CITRATE 100 MG PO TABS: 50.0000 mg | ORAL_TABLET | Freq: Every day | ORAL | 11 refills | Status: DC | PRN

## 2020-01-26 MED ORDER — TAMSULOSIN HCL 0.4 MG PO CAPS: 0.4000 mg | ORAL_CAPSULE | Freq: Every day | ORAL | 3 refills | Status: DC

## 2020-01-26 MED ORDER — FINASTERIDE 5 MG PO TABS: 5.0000 mg | ORAL_TABLET | Freq: Every day | ORAL | 3 refills | Status: DC

## 2020-01-26 NOTE — Assessment & Plan Note (Addendum)
Chronic, stable. Discussed finasteride/flomax use. Discussed cialis - no significant benefit. Some delayed ejaculation - both finasteride and flomax may contribute. Discussed possible surgical eval for BPH.  IPSS = 5/2

## 2020-01-26 NOTE — Assessment & Plan Note (Signed)
Preventative protocols reviewed and updated unless pt declined. Discussed healthy diet and lifestyle.  

## 2020-01-26 NOTE — Assessment & Plan Note (Signed)
Chronic, stable off meds.  The 10-year ASCVD risk score William Tucker DC William Tucker., et al., 2013) is: 4%   Values used to calculate the score:     Age: 54 years     Sex: Male     Is Non-Hispanic African American: No     Diabetic: No     Tobacco smoker: No     Systolic Blood Pressure: [id number] mmHg     Is BP treated: No     HDL Cholesterol: 50.8 mg/dL     Total Cholesterol: 181 mg/dL

## 2020-01-26 NOTE — Assessment & Plan Note (Signed)
See above. Price out viagra. Discussed option of generic sildenafil.

## 2020-01-26 NOTE — Progress Notes (Signed)
This visit was conducted in person.  BP 134/86 (BP Location: Left Arm, Patient Position: Sitting, Cuff Size: Normal)   Pulse 74   Temp 97.9 F (36.6 C) (Temporal)   Ht 5' 7.75" (1.721 m)   Wt 180 lb 1 oz (81.7 kg)   SpO2 98%   BMI 27.58 kg/m    CC: CPE Subjective:    Patient ID: William Tucker, male    DOB: 20-Jul-1966, 54 y.o.   MRN: [id number]  HPI: William Tucker is a 54 y.o. 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   He's been taking zinc and vit D but had not been taking vit B12. Denies fatigue or paresthesias.  OSA - has started CPAP use regularly - sees Dr Mortimer Fries.   Graves - followed by endo Dr Cruzita Lederer. Stable period off meds.  BPH - notes trouble starting urine, nocturia x1. Notes some trouble with ejaculation. Longstanding on flomax/finasteride - last year we tapered off flomax and onto daily cialis 2.5mg  - stopping flomax worsened BPH symptoms.   Preventative: COLONOSCOPY 02/2018 - 3 benign polyps, rpt 5 yrs Carlean Purl) Prostate - gets checked yearly 2/2 alpha blocker and 5a reductase inhibitor for BPH.  Flu shot - declines  Tetanus - 1998, 2011 COVID vaccine - declines  Shingrix - discussed, will start today.  Seat belt use discussed Sunscreen use discussed. No changing moles on skin. Sees dermatologist yearly.  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 Interim medical history since our last visit reviewed. Allergies and medications reviewed and updated. Outpatient Medications  Prior to Visit  Medication Sig Dispense Refill  . Cholecalciferol (VITAMIN D3 PO) Take by mouth daily.    . cyanocobalamin 1000 MCG tablet Take 1,000 mcg by mouth daily.    Marland Kitchen loratadine (CLARITIN) 10 MG tablet Take 10 mg by mouth daily.      . Multiple Vitamins-Minerals (ZINC PO) Take by mouth daily.    . finasteride (PROSCAR) 5 MG tablet TAKE 1 TABLET BY MOUTH  DAILY 90 tablet 0  . Tadalafil 2.5 MG TABS TAKE 1 TABLET BY MOUTH  DAILY 90 tablet 0  . tamsulosin (FLOMAX) 0.4 MG CAPS capsule TAKE 1 CAPSULE BY MOUTH  DAILY 90 capsule 0  . 0.9 %  sodium chloride infusion      No facility-administered medications prior to visit.     Per HPI unless specifically indicated in ROS section below Review of Systems  Constitutional: Negative for activity change, appetite change, chills, fatigue, fever and unexpected weight change.  HENT: Negative for hearing loss.   Eyes: Negative for visual disturbance.  Respiratory: Negative for cough, chest tightness, shortness of breath and wheezing.   Cardiovascular: Negative for chest pain, palpitations and leg swelling.  Gastrointestinal: Negative for abdominal distention, abdominal pain, blood in stool, constipation, diarrhea, nausea and vomiting.  Genitourinary: Negative for difficulty urinating and hematuria.  Musculoskeletal: Negative for arthralgias, myalgias and neck pain.  Skin: Negative for rash.  Neurological: Negative for dizziness, seizures, syncope and headaches.  Hematological: Negative  for adenopathy. Does not bruise/bleed easily.  Psychiatric/Behavioral: Negative for dysphoric mood. The patient is not nervous/anxious.    Objective:    BP 134/86 (BP Location: Left Arm, Patient Position: Sitting, Cuff Size: Normal)   Pulse 74   Temp 97.9 F (36.6 C) (Temporal)   Ht 5' 7.75" (1.721 m)   Wt 180 lb 1 oz (81.7 kg)   SpO2 98%   BMI 27.58 kg/m   Wt Readings from Last 3 Encounters:  01/26/20 180 lb 1 oz (81.7 kg)  06/24/19 172 lb (78 kg)    05/06/19 179 lb (81.2 kg)    Physical Exam Vitals and nursing note reviewed.  Constitutional:      General: He is not in acute distress.    Appearance: Normal appearance. He is well-developed. He is not ill-appearing.  HENT:     Head: Normocephalic and atraumatic.     Right Ear: Hearing, tympanic membrane, ear canal and external ear normal.     Left Ear: Hearing, tympanic membrane, ear canal and external ear normal.  Eyes:     General: No scleral icterus.    Extraocular Movements: Extraocular movements intact.     Conjunctiva/sclera: Conjunctivae normal.     Pupils: Pupils are equal, round, and reactive to light.  Cardiovascular:     Rate and Rhythm: Normal rate and regular rhythm.     Pulses: Normal pulses.          Radial pulses are 2+ on the right side and 2+ on the left side.     Heart sounds: Normal heart sounds. No murmur.  Pulmonary:     Effort: Pulmonary effort is normal. No respiratory distress.     Breath sounds: Normal breath sounds. No wheezing, rhonchi or rales.  Abdominal:     General: Abdomen is flat. Bowel sounds are normal. There is no distension.     Palpations: Abdomen is soft. There is no mass.     Tenderness: There is no abdominal tenderness. There is no guarding or rebound.     Hernia: No hernia is present.  Genitourinary:    Pubic Area: No rash.      Penis: Normal and circumcised.      Testes: Normal.        Right: Mass, tenderness or swelling not present.        Left: Mass, tenderness or swelling not present.     Epididymis:     Right: Normal.     Left: Normal.     Prostate: Enlarged (25gm). Not tender and no nodules present.     Rectum: Normal. No mass, tenderness, anal fissure, external hemorrhoid or internal hemorrhoid. Normal anal tone.     Comments: R inguinal laxity Musculoskeletal:        General: Normal range of motion.     Cervical back: Normal range of motion and neck supple.     Right lower leg: No edema.     Left lower leg: No edema.   Lymphadenopathy:     Cervical: No cervical adenopathy.     Lower Body: No right inguinal adenopathy. No left inguinal adenopathy.  Skin:    General: Skin is warm and dry.     Findings: No rash.  Neurological:     General: No focal deficit present.     Mental Status: He is alert and oriented to person, place, and time.     Comments: CN grossly intact, station and gait intact  Psychiatric:  Mood and Affect: Mood normal.        Behavior: Behavior normal.        Thought Content: Thought content normal.        Judgment: Judgment normal.       Results for orders placed or performed in visit on 01/19/20  T4, free  Result Value Ref Range   Free T4 0.71 0.60 - 1.60 ng/dL  Vitamin B12  Result Value Ref Range   Vitamin B-12 226 211 - 911 pg/mL  PSA  Result Value Ref Range   PSA 0.10 0.10 - 4.00 ng/mL  TSH  Result Value Ref Range   TSH 1.36 0.35 - 4.50 uIU/mL  Comprehensive metabolic panel  Result Value Ref Range   Sodium 139 135 - 145 mEq/L   Potassium 4.3 3.5 - 5.1 mEq/L   Chloride 104 96 - 112 mEq/L   CO2 29 19 - 32 mEq/L   Glucose, Bld 108 (H) 70 - 99 mg/dL   BUN 15 6 - 23 mg/dL   Creatinine, Ser 1.21 0.40 - 1.50 mg/dL   Total Bilirubin 0.6 0.2 - 1.2 mg/dL   Alkaline Phosphatase 42 39 - 117 U/L   AST 13 0 - 37 U/L   ALT 15 0 - 53 U/L   Total Protein 6.6 6.0 - 8.3 g/dL   Albumin 4.5 3.5 - 5.2 g/dL   GFR 62.96 >60.00 mL/min   Calcium 9.4 8.4 - 10.5 mg/dL  Lipid panel  Result Value Ref Range   Cholesterol 181 0 - 200 mg/dL   Triglycerides 66.0 0.0 - 149.0 mg/dL   HDL 50.80 >39.00 mg/dL   VLDL 13.2 0.0 - 40.0 mg/dL   LDL Cholesterol 117 (H) 0 - 99 mg/dL   Total CHOL/HDL Ratio 4    NonHDL 130.39    Assessment & Plan:  This visit occurred during the SARS-CoV-2 public health emergency.  Safety protocols were in place, including screening questions prior to the visit, additional usage of staff PPE, and extensive cleaning of exam room while observing appropriate  contact time as indicated for disinfecting solutions.   Problem List Items Addressed This Visit    Vitamin B12 deficiency    Has been off oral b12 replacement - will give B12 shot today and then restart oral replacement.       OSA on CPAP    Benefiting from CPAP. Followed by pulm.       HLD (hyperlipidemia)    Chronic, stable off meds.  The 10-year ASCVD risk score Mikey Bussing DC Brooke Bonito., et al., 2013) is: 4%   Values used to calculate the score:     Age: 89 years     Sex: Male     Is Non-Hispanic African American: No     Diabetic: No     Tobacco smoker: No     Systolic Blood Pressure: [id number] mmHg     Is BP treated: No     HDL Cholesterol: 50.8 mg/dL     Total Cholesterol: 181 mg/dL       Relevant Medications   sildenafil (VIAGRA) 100 MG tablet   Healthcare maintenance - Primary    Preventative protocols reviewed and updated unless pt declined. Discussed healthy diet and lifestyle.       Graves disease (Chronic)    Stable period off meds.  Followed by endo, appreciate her care.       Erectile dysfunction    See above. Price out viagra. Discussed option of generic sildenafil.  Benign prostatic hyperplasia with urinary obstruction    Chronic, stable. Discussed finasteride/flomax use. Discussed cialis - no significant benefit. Some delayed ejaculation - both finasteride and flomax may contribute. Discussed possible surgical eval for BPH.  IPSS = 5/2      Relevant Medications   tamsulosin (FLOMAX) 0.4 MG CAPS capsule   finasteride (PROSCAR) 5 MG tablet    Other Visit Diagnoses    Need for shingles vaccine       Relevant Orders   Varicella-zoster vaccine IM (Completed)       Meds ordered this encounter  Medications  . tamsulosin (FLOMAX) 0.4 MG CAPS capsule    Sig: Take 1 capsule (0.4 mg total) by mouth daily.    Dispense:  90 capsule    Refill:  3  . finasteride (PROSCAR) 5 MG tablet    Sig: Take 1 tablet (5 mg total) by mouth daily.    Dispense:  90 tablet     Refill:  3  . sildenafil (VIAGRA) 100 MG tablet    Sig: Take 0.5-1 tablets (50-100 mg total) by mouth daily as needed for erectile dysfunction.    Dispense:  5 tablet    Refill:  11  . cyanocobalamin ((VITAMIN B-12)) injection 1,000 mcg   Orders Placed This Encounter  Procedures  . Varicella-zoster vaccine IM    Patient instructions: Shingrix today. Return in 2-6 months for nurse visit to complete series.  B12 shot today.  Good to see you today Return as needed or in 1 year for next physical.   Follow up plan: Return in about 1 year (around 01/25/2021) for annual exam, prior fasting for blood work.  Ria Bush, MD

## 2020-01-26 NOTE — Assessment & Plan Note (Addendum)
Benefiting from CPAP. Followed by pulm.

## 2020-01-26 NOTE — Assessment & Plan Note (Signed)
Has been off oral b12 replacement - will give B12 shot today and then restart oral replacement.

## 2020-01-26 NOTE — Patient Instructions (Addendum)
Shingrix today. Return in 2-6 months for nurse visit to complete series.  B12 shot today.  Good to see you today Return as needed or in 1 year for next physical.   Health Maintenance, Male Adopting a healthy lifestyle and getting preventive care are important in promoting health and wellness. Ask your health care provider about:  The right schedule for you to have regular tests and exams.  Things you can do on your own to prevent diseases and keep yourself healthy. What should I know about diet, weight, and exercise? Eat a healthy diet   Eat a diet that includes plenty of vegetables, fruits, low-fat dairy products, and lean protein.  Do not eat a lot of foods that are high in solid fats, added sugars, or sodium. Maintain a healthy weight Body mass index (BMI) is a measurement that can be used to identify possible weight problems. It estimates body fat based on height and weight. Your health care provider can help determine your BMI and help you achieve or maintain a healthy weight. Get regular exercise Get regular exercise. This is one of the most important things you can do for your health. Most adults should:  Exercise for at least 150 minutes each week. The exercise should increase your heart rate and make you sweat (moderate-intensity exercise).  Do strengthening exercises at least twice a week. This is in addition to the moderate-intensity exercise.  Spend less time sitting. Even light physical activity can be beneficial. Watch cholesterol and blood lipids Have your blood tested for lipids and cholesterol at 52 years of age, then have this test every 5 years. You may need to have your cholesterol levels checked more often if:  Your lipid or cholesterol levels are high.  You are older than 52 years of age.  You are at high risk for heart disease. What should I know about cancer screening? Many types of cancers can be detected early and may often be prevented. Depending on  your health history and family history, you may need to have cancer screening at various ages. This may include screening for:  Colorectal cancer.  Prostate cancer.  Skin cancer.  Lung cancer. What should I know about heart disease, diabetes, and high blood pressure? Blood pressure and heart disease  High blood pressure causes heart disease and increases the risk of stroke. This is more likely to develop in people who have high blood pressure readings, are of African descent, or are overweight.  Talk with your health care provider about your target blood pressure readings.  Have your blood pressure checked: ? Every 3-5 years if you are 58-48 years of age. ? Every year if you are 70 years old or older.  If you are between the ages of 40 and 29 and are a current or former smoker, ask your health care provider if you should have a one-time screening for abdominal aortic aneurysm (AAA). Diabetes Have regular diabetes screenings. This checks your fasting blood sugar level. Have the screening done:  Once every three years after age 74 if you are at a normal weight and have a low risk for diabetes.  More often and at a younger age if you are overweight or have a high risk for diabetes. What should I know about preventing infection? Hepatitis B If you have a higher risk for hepatitis B, you should be screened for this virus. Talk with your health care provider to find out if you are at risk for hepatitis B infection.  Hepatitis C Blood testing is recommended for:  Everyone born from [birth year] through 1963.  Anyone with known risk factors for hepatitis C. Sexually transmitted infections (STIs)  You should be screened each year for STIs, including gonorrhea and chlamydia, if: ? You are sexually active and are younger than 52 years of age. ? You are older than 52 years of age and your health care provider tells you that you are at risk for this type of infection. ? Your sexual activity has  changed since you were last screened, and you are at increased risk for chlamydia or gonorrhea. Ask your health care provider if you are at risk.  Ask your health care provider about whether you are at high risk for HIV. Your health care provider may recommend a prescription medicine to help prevent HIV infection. If you choose to take medicine to prevent HIV, you should first get tested for HIV. You should then be tested every 3 months for as long as you are taking the medicine. Follow these instructions at home: Lifestyle  Do not use any products that contain nicotine or tobacco, such as cigarettes, e-cigarettes, and chewing tobacco. If you need help quitting, ask your health care provider.  Do not use street drugs.  Do not share needles.  Ask your health care provider for help if you need support or information about quitting drugs. Alcohol use  Do not drink alcohol if your health care provider tells you not to drink.  If you drink alcohol: ? Limit how much you have to 0-2 drinks a day. ? Be aware of how much alcohol is in your drink. In the [place] one drink equals one 12 oz bottle of beer (355 mL), one 5 oz glass of wine (148 mL), or one 1 oz glass of hard liquor (44 mL). General instructions  Schedule regular health, dental, and eye exams.  Stay current with your vaccines.  Tell your health care provider if: ? You often feel depressed. ? You have ever been abused or do not feel safe at home. Summary  Adopting a healthy lifestyle and getting preventive care are important in promoting health and wellness.  Follow your health care provider's instructions about healthy diet, exercising, and getting tested or screened for diseases.  Follow your health care provider's instructions on monitoring your cholesterol and blood pressure. This information is not intended to replace advice given to you by your health care provider. Make sure you discuss any questions you have with your  health care provider. Document Revised: 10/02/2018 Document Reviewed: 10/02/2018 Elsevier Patient Education  2020 [employer].

## 2020-01-26 NOTE — Assessment & Plan Note (Signed)
Stable period off meds.  Followed by endo, appreciate her care.

## 2020-04-06 ENCOUNTER — Encounter: Payer: Self-pay | Admitting: Family Medicine

## 2020-04-06 ENCOUNTER — Ambulatory Visit: Payer: 59 | Admitting: Family Medicine

## 2020-04-06 ENCOUNTER — Ambulatory Visit: Payer: 59

## 2020-04-06 ENCOUNTER — Other Ambulatory Visit: Payer: Self-pay

## 2020-04-06 VITALS — BP 126/80 | HR 80 | Temp 98.2°F | Ht 67.75 in | Wt 177.1 lb

## 2020-04-06 DIAGNOSIS — Z23 Encounter for immunization: Secondary | ICD-10-CM | POA: Diagnosis not present

## 2020-04-06 DIAGNOSIS — M7711 Lateral epicondylitis, right elbow: Secondary | ICD-10-CM | POA: Diagnosis not present

## 2020-04-06 NOTE — Assessment & Plan Note (Signed)
Story/exam consistent with R tennis elbow. Supportive care reviewed. Update if not improving with treatment for SM eval or PT course. Pt agrees with plan.

## 2020-04-06 NOTE — Patient Instructions (Signed)
Limit repetitive use of right hand, lift underhanded, may continue using tennis elbow strap as well as ibuprofen. Add voltaren gel topically three times a day. This should all help speed recovery. If not improving either return to see Dr Lorelei Pont our sports medicine doctor or let me know for course of physical therapy.

## 2020-04-06 NOTE — Progress Notes (Signed)
This visit was conducted in person.  BP 126/80 (BP Location: Left Arm, Patient Position: Sitting, Cuff Size: Normal)   Pulse 80   Temp 98.2 F (36.8 C) (Temporal)   Ht 5' 7.75" (1.721 m)   Wt 177 lb 1 oz (80.3 kg)   SpO2 97%   BMI 27.12 kg/m    CC: R elbow pain Subjective:    Patient ID: William Tucker, male    DOB: April 29, 1966, 54 y.o.   MRN: [id number]  HPI: William Tucker is a 54 y.o. male presenting on 04/06/2020 for Elbow Pain (C/o right elbow pain. Started about 1 mo ago.  Tried ibuprofen, helpful. H/o tennis elbow.  Pain has occurred off and on for a few yrs. )   1 mo h/o R elbow pain without inciting trauma/injury. Points to lateral elbow at lateral epicondyle. Treating with ibuprofen 800mg  twice daily with benefit. Has tried tennis elbow strap but this time hasn't been helping. Pain shoots down to fingers and up arm to biceps muscle. He's been doing more weed eating prior to it flaring.   Would also like 2nd shingrix vaccine.      Relevant past medical, surgical, family and social history reviewed and updated as indicated. Interim medical history since our last visit reviewed. Allergies and medications reviewed and updated. Outpatient Medications Prior to Visit  Medication Sig Dispense Refill  . Cholecalciferol (VITAMIN D3 PO) Take by mouth daily.    . cyanocobalamin 1000 MCG tablet Take 1,000 mcg by mouth daily.    . finasteride (PROSCAR) 5 MG tablet Take 1 tablet (5 mg total) by mouth daily. 90 tablet 3  . loratadine (CLARITIN) 10 MG tablet Take 10 mg by mouth daily.      . Multiple Vitamins-Minerals (ZINC PO) Take by mouth daily.    . sildenafil (VIAGRA) 100 MG tablet Take 0.5-1 tablets (50-100 mg total) by mouth daily as needed for erectile dysfunction. 5 tablet 11  . tamsulosin (FLOMAX) 0.4 MG CAPS capsule Take 1 capsule (0.4 mg total) by mouth daily. 90 capsule 3   No facility-administered medications prior to visit.     Per HPI unless specifically  indicated in ROS section below Review of Systems Objective:  BP 126/80 (BP Location: Left Arm, Patient Position: Sitting, Cuff Size: Normal)   Pulse 80   Temp 98.2 F (36.8 C) (Temporal)   Ht 5' 7.75" (1.721 m)   Wt 177 lb 1 oz (80.3 kg)   SpO2 97%   BMI 27.12 kg/m   Wt Readings from Last 3 Encounters:  04/06/20 177 lb 1 oz (80.3 kg)  01/26/20 180 lb 1 oz (81.7 kg)  06/24/19 172 lb (78 kg)      Physical Exam Vitals and nursing note reviewed.  Constitutional:      Appearance: Normal appearance. He is not ill-appearing.  Musculoskeletal:        General: Tenderness present. No swelling. Normal range of motion.     Right lower leg: No edema.     Left lower leg: No edema.     Comments:  FROM bilateral elbows No pain at olecranon or medial epicondyle Point tender to lateral epicondyle  Reproducible pain with forced wrist extension against resistance  Skin:    General: Skin is warm and dry.     Findings: No rash.  Neurological:     Mental Status: He is alert.  Psychiatric:        Mood and Affect: Mood normal.  Assessment & Plan:  This visit occurred during the SARS-CoV-2 public health emergency.  Safety protocols were in place, including screening questions prior to the visit, additional usage of staff PPE, and extensive cleaning of exam room while observing appropriate contact time as indicated for disinfecting solutions.   Problem List Items Addressed This Visit    Right lateral epicondylitis - Primary    Story/exam consistent with R tennis elbow. Supportive care reviewed. Update if not improving with treatment for SM eval or PT course. Pt agrees with plan.        Other Visit Diagnoses    Need for shingles vaccine       Relevant Orders   Varicella-zoster vaccine IM (Completed)       No orders of the defined types were placed in this encounter.  Orders Placed This Encounter  Procedures  . Varicella-zoster vaccine IM    Patient Instructions  Limit  repetitive use of right hand, lift underhanded, may continue using tennis elbow strap as well as ibuprofen. Add voltaren gel topically three times a day. This should all help speed recovery. If not improving either return to see Dr Lorelei Pont our sports medicine doctor or let me know for course of physical therapy.    Follow up plan: Return if symptoms worsen or fail to improve.  Ria Bush, MD

## 2020-06-22 NOTE — Progress Notes (Signed)
Patient ID: William Tucker, male   DOB: 1966-06-11, 54 y.o.   MRN: [id number]  This visit occurred during the SARS-CoV-2 public health emergency.  Safety protocols were in place, including screening questions prior to the visit, additional usage of staff PPE, and extensive cleaning of exam room while observing appropriate contact time as indicated for disinfecting solutions.   HPI  William Tucker is a 54 y.o.-year-old male, returning for f/u for Graves ds and B12 def. Last visit 1 year ago.  He started on a CPAP since last OV. Still poor sleep occasionally.   He retired recently. He is walking more now >> lost 8-10 lbs in last year.  Graves ds: Reviewed history: Pt had a Thyroid Uptake and scan in 10/2012: Uptake 29%, at ULN, scan uniform, c/w Graves ds.  A thyroid U/S then showed a tiny nodule, no other defects.  We initially started him on methimazole 10 mg in a.m. and 5 mg in p.m. we were then able to taper the medication dose down and he was able to, in 02/2016.  PFTs obtained afterwards remained normal.  Reviewed his TFTs: Lab Results  Component Value Date   TSH 1.36 01/19/2020   TSH 1.25 06/24/2019   TSH 0.90 10/24/2018   TSH 1.17 06/19/2018   TSH 0.96 06/19/2017   TSH 0.86 01/11/2017   TSH 0.72 06/19/2016   TSH 1.48 03/22/2016   TSH 1.10 11/30/2015   TSH 0.80 08/18/2015   FREET4 0.71 01/19/2020   FREET4 0.83 06/24/2019   FREET4 0.82 10/24/2018   FREET4 0.77 06/19/2018   FREET4 0.68 06/19/2017   FREET4 0.72 06/19/2016   FREET4 1.0 03/22/2016   FREET4 0.87 11/30/2015   FREET4 0.88 08/18/2015   FREET4 0.81 07/01/2015  TR Ab 2013 : 16.9% (<16)  Latest TSI level is undetectable: Lab Results  Component Value Date   TSI <89 06/19/2017   TSI 93 06/19/2016   Pt denies: - feeling nodules in neck - hoarseness - dysphagia - choking - SOB with lying down  Pt denies: - unintentional weight loss - heat intolerance - tremors - palpitations - anxiety -  hyperdefecation - hair loss  He continues to experience fatigue.  He had a sleep study >> OSA.  He continues to have postnasal drip and Nasonex advised.  He also has a history of BPH -sees [facility] urology, HL, psoriasis, leukopenia.  Vitamin B-12 deficiency:   Reviewed his B12 levels: Lab Results  Component Value Date   VITAMINB12 226 01/19/2020   VITAMINB12 598 10/24/2018   VITAMINB12 349 06/19/2018   VITAMINB12 1,398 (H) 06/19/2017   VITAMINB12 459 06/19/2016   VITAMINB12 533 08/18/2015   VITAMINB12 171 (L) 10/08/2013   VITAMINB12 265 09/24/2006   He is on 2500 mcg B12 daily-but was off this at last check with PCP in 12/2019 - off at the time as he ran out, but restarted afterwards.  He was previously on a testosterone booster.  ROS: Constitutional: no weight gain/+ weight loss, no fatigue, no subjective hyperthermia, no subjective hypothermia Eyes: no blurry vision, no xerophthalmia ENT: no sore throat, + see HPI Cardiovascular: no CP/no SOB/no palpitations/no leg swelling Respiratory: no cough/no SOB/no wheezing Gastrointestinal: no N/no V/no D/no C/no acid reflux Musculoskeletal: no muscle aches/no joint aches Skin: no rashes, no hair loss Neurological: no tremors/no numbness/no tingling/no dizziness  I reviewed pt's medications, allergies, PMH, social hx, family hx, and changes were documented in the history of present illness. Otherwise, unchanged from my initial visit  note.  Past Medical History:  Diagnosis Date  . Allergy   . BPH (benign prostatic hypertrophy)    on proscar and flomax (saw Dr. Terance Hart)  . Hx of colonic polyps 03/29/2018  . OSA on CPAP    auto-CPAP - Kasa  . Perennial allergic rhinitis    eval by Dr. Redmond Baseman with ENT 2011  . Subclinical hyperthyroidism    Graves (upper normal thyroid uptake, mildly elevated TSH R Ab)   Past Surgical History:  Procedure Laterality Date  . COLONOSCOPY  2009   WNL, rec rpt 10 yrs  . COLONOSCOPY  02/2018    3 benign polyps, rpt 5 yrs Carlean Purl)  . CYSTOSCOPY  03/28/2000   biopsy negative for ICS  . TONSILLECTOMY  as a child   Social History   Socioeconomic History  . Marital status: Married    Spouse name: Not on file  . Number of children: 2  . Years of education: Not on file  . Highest education level: Not on file  Occupational History  . Occupation: Financial risk analyst: [employer]  Tobacco Use  . Smoking status: Never Smoker  . Smokeless tobacco: Former Systems developer    Types: Snuff, Chew  . Tobacco comment: Occasional dip snuff 20 years ago  Substance and Sexual Activity  . Alcohol use: Yes    Alcohol/week: 4.0 standard drinks    Types: 4 Standard drinks or equivalent per week    Comment: couple of times a week  . Drug use: No  . Sexual activity: Yes  Other Topics Concern  . Not on file  Social History Narrative   Caffeine: 3-4 cups coffee/day   Married and lives with wife and 2 sons   Occupation: Event organiser   Activity: walks several miles daily   Diet: good water, fruits/vegetables daily   Social Determinants of Health   Financial Resource Strain:   . Difficulty of Paying Living Expenses: Not on file  Food Insecurity:   . Worried About Charity fundraiser in the Last Year: Not on file  . Ran Out of Food in the Last Year: Not on file  Transportation Needs:   . Lack of Transportation (Medical): Not on file  . Lack of Transportation (Non-Medical): Not on file  Physical Activity:   . Days of Exercise per Week: Not on file  . Minutes of Exercise per Session: Not on file  Stress:   . Feeling of Stress : Not on file  Social Connections:   . Frequency of Communication with Friends and Family: Not on file  . Frequency of Social Gatherings with Friends and Family: Not on file  . Attends Religious Services: Not on file  . Active Member of Clubs or Organizations: Not on file  . Attends Archivist Meetings: Not on file  . Marital Status: Not on file   Intimate Partner Violence:   . Fear of Current or Ex-Partner: Not on file  . Emotionally Abused: Not on file  . Physically Abused: Not on file  . Sexually Abused: Not on file   Current Outpatient Medications on File Prior to Visit  Medication Sig Dispense Refill  . Cholecalciferol (VITAMIN D3 PO) Take by mouth daily.    . cyanocobalamin 1000 MCG tablet Take 1,000 mcg by mouth daily.    . finasteride (PROSCAR) 5 MG tablet Take 1 tablet (5 mg total) by mouth daily. 90 tablet 3  . loratadine (CLARITIN) 10 MG tablet Take 10 mg by  mouth daily.      . Multiple Vitamins-Minerals (ZINC PO) Take by mouth daily.    . sildenafil (VIAGRA) 100 MG tablet Take 0.5-1 tablets (50-100 mg total) by mouth daily as needed for erectile dysfunction. 5 tablet 11  . tamsulosin (FLOMAX) 0.4 MG CAPS capsule Take 1 capsule (0.4 mg total) by mouth daily. 90 capsule 3   No current facility-administered medications on file prior to visit.   No Known Allergies Family History  Problem Relation Age of Onset  . Hyperlipidemia Mother   . Colon polyps Mother   . Thyroid disease Mother        s/p iodine treatment  . Hyperlipidemia Father   . Hypertension Father   . Stomach cancer Father 66  . Cancer Paternal Grandmother 49       colon  . Stroke Maternal Grandmother   . Stroke Maternal Uncle   . CAD Paternal Grandfather        small MI  . Diabetes Neg Hx     PE: BP 138/70   Pulse 62   Ht 5' 7.75" (1.721 m)   Wt 162 lb (73.5 kg)   SpO2 97%   BMI 24.81 kg/m  Body mass index is 24.81 kg/m.   Wt Readings from Last 3 Encounters:  06/23/20 162 lb (73.5 kg)  04/06/20 177 lb 1 oz (80.3 kg)  01/26/20 180 lb 1 oz (81.7 kg)   Constitutional: normal weight, in NAD Eyes: PERRLA, EOMI, no exophthalmos ENT: moist mucous membranes, no thyromegaly, no cervical lymphadenopathy Cardiovascular: RRR, No MRG Respiratory: CTA B Gastrointestinal: abdomen soft, NT, ND, BS+ Musculoskeletal: no deformities, strength  intact in all 4 Skin: moist, warm, no rashes Neurological: no tremor with outstretched hands, DTR normal in all 4  ASSESSMENT: 1. Graves Ds.  2. B12 def  3.  Left upper neck lymphadenopathy  PLAN:  1. Patient with low history of Graves' disease, without thyrotoxic symptoms -He was initially on methimazole, which we were able to taper down and then stop definitively in 02/2016.  His TFTs remained normal afterwards including at last visit. -Also, latest TSI level was undetectable -We will repeat his TFTs today and may need to restart low-dose methimazole if abnormal -He is asymptomatic but he did have a significant amount of weight loss (per his report 8 to 10 pounds, however, per our chart approximately 18 lbs since 01/2020 which may be due to retiring, eating less out and being more active) -No signs of Graves' ophthalmopathy -I will see him back in 1 year, however, we discussed about thyrotoxic symptoms and advised him to let me know if he starts to experience them before our next visit  2. B12 def -No numbness/tingling.  He had fatigue possibly related to sleep apnea.  On CPAP. -We started B12 supplementation with 2500 mcg daily of B12.  He was off the medication at last visit with PCP and his vitamin B12 was low normal at 226 in 09/2020 - but was off B12 at that time and restarted since -We will recheck today  3.  Left upper neck lymphadenopathy -He had an indurated left upper neck enlarged lymph node in the past but this was not palpable at last visit -Also, this is not palpable now -No further investigation needed for this -this is most likely related to his postnasal drip  Component     Latest Ref Rng & Units 06/23/2020  Triiodothyronine,Free,Serum     2.3 - 4.2 pg/mL 3.3  T4,Free(Direct)  0.60 - 1.60 ng/dL 0.83  TSH     0.35 - 4.50 uIU/mL 1.85  Vitamin B12     211 - 911 pg/mL 592  Excellent results.  Philemon Kingdom, MD PhD [facility] Endocrinology

## 2020-06-23 ENCOUNTER — Ambulatory Visit (INDEPENDENT_AMBULATORY_CARE_PROVIDER_SITE_OTHER): Payer: 59 | Admitting: Internal Medicine

## 2020-06-23 ENCOUNTER — Encounter: Payer: Self-pay | Admitting: Internal Medicine

## 2020-06-23 ENCOUNTER — Other Ambulatory Visit (INDEPENDENT_AMBULATORY_CARE_PROVIDER_SITE_OTHER): Payer: 59

## 2020-06-23 VITALS — BP 138/70 | HR 62 | Ht 67.75 in | Wt 162.0 lb

## 2020-06-23 DIAGNOSIS — E0500 Thyrotoxicosis with diffuse goiter without thyrotoxic crisis or storm: Secondary | ICD-10-CM

## 2020-06-23 DIAGNOSIS — R590 Localized enlarged lymph nodes: Secondary | ICD-10-CM | POA: Diagnosis not present

## 2020-06-23 DIAGNOSIS — E538 Deficiency of other specified B group vitamins: Secondary | ICD-10-CM

## 2020-06-23 LAB — T3, FREE: T3, Free: 3.3 pg/mL (ref 2.3–4.2)

## 2020-06-23 LAB — T4, FREE: Free T4: 0.83 ng/dL (ref 0.60–1.60)

## 2020-06-23 LAB — TSH: TSH: 1.85 u[IU]/mL (ref 0.35–4.50)

## 2020-06-23 LAB — VITAMIN B12: Vitamin B-12: 592 pg/mL (ref 211–911)

## 2020-06-23 NOTE — Patient Instructions (Addendum)
Please stop at [facility] lab.  Please continue 5000 mcg of B12 every other day.  Please come back for a follow-up appointment in 1 year.  Enjoy retirement!

## 2020-12-10 ENCOUNTER — Other Ambulatory Visit: Payer: Self-pay | Admitting: Family Medicine

## 2021-01-23 ENCOUNTER — Other Ambulatory Visit: Payer: Self-pay | Admitting: Family Medicine

## 2021-01-23 DIAGNOSIS — E0500 Thyrotoxicosis with diffuse goiter without thyrotoxic crisis or storm: Secondary | ICD-10-CM

## 2021-01-23 DIAGNOSIS — N138 Other obstructive and reflux uropathy: Secondary | ICD-10-CM

## 2021-01-23 DIAGNOSIS — E785 Hyperlipidemia, unspecified: Secondary | ICD-10-CM

## 2021-01-23 DIAGNOSIS — Z1159 Encounter for screening for other viral diseases: Secondary | ICD-10-CM

## 2021-01-23 DIAGNOSIS — E538 Deficiency of other specified B group vitamins: Secondary | ICD-10-CM

## 2021-01-25 ENCOUNTER — Telehealth: Payer: Self-pay

## 2021-01-25 ENCOUNTER — Other Ambulatory Visit: Payer: 59

## 2021-01-25 NOTE — Telephone Encounter (Signed)
Called and left vm ( detailed per DPR) for the patient to return our call to reschedule. EM

## 2021-01-25 NOTE — Telephone Encounter (Signed)
[facility] Night - Client Nonclinical Telephone Record AccessNurse Client [facility] Night - Client Client Site [place] - Night Contact Type Call Who Is Calling Patient / Member / Family / Caregiver Caller Name Deno Sida Caller Phone Number [phone] Patient Name William Tucker Patient DOB Nov 19, 1965 Call Type Message Only Information Provided Reason for Call Request to Reschedule Office Appointment Initial Comment Caller states he needs to reschedule appt for lab at 8 am he ate, Additional Comment hrs prov Disp. Time Disposition Final User 01/25/2021 7:43:45 AM General Information Provided Yes Idolina Primer Call Closed By: Idolina Primer Transaction Date/Time: 01/25/2021 7:37:44 AM (ET)

## 2021-01-28 ENCOUNTER — Encounter: Payer: 59 | Admitting: Family Medicine

## 2021-02-10 ENCOUNTER — Other Ambulatory Visit: Payer: Self-pay | Admitting: Family Medicine

## 2021-02-10 NOTE — Telephone Encounter (Signed)
Tadalafil changed to sildenafil.  Refill denied.

## 2021-03-10 ENCOUNTER — Other Ambulatory Visit: Payer: Self-pay

## 2021-03-10 ENCOUNTER — Other Ambulatory Visit (INDEPENDENT_AMBULATORY_CARE_PROVIDER_SITE_OTHER): Payer: 59

## 2021-03-10 DIAGNOSIS — E785 Hyperlipidemia, unspecified: Secondary | ICD-10-CM | POA: Diagnosis not present

## 2021-03-10 DIAGNOSIS — Z1159 Encounter for screening for other viral diseases: Secondary | ICD-10-CM

## 2021-03-10 DIAGNOSIS — N401 Enlarged prostate with lower urinary tract symptoms: Secondary | ICD-10-CM | POA: Diagnosis not present

## 2021-03-10 DIAGNOSIS — E0500 Thyrotoxicosis with diffuse goiter without thyrotoxic crisis or storm: Secondary | ICD-10-CM

## 2021-03-10 DIAGNOSIS — E538 Deficiency of other specified B group vitamins: Secondary | ICD-10-CM | POA: Diagnosis not present

## 2021-03-10 DIAGNOSIS — N138 Other obstructive and reflux uropathy: Secondary | ICD-10-CM | POA: Diagnosis not present

## 2021-03-10 LAB — LIPID PANEL
Cholesterol: 171 mg/dL (ref 0–200)
HDL: 58.9 mg/dL (ref >39.00)
LDL Cholesterol: 91 mg/dL (ref 0–99)
NonHDL: 111.8
Total CHOL/HDL Ratio: 3
Triglycerides: 102 mg/dL (ref 0.0–149.0)
VLDL: 20.4 mg/dL (ref 0.0–40.0)

## 2021-03-10 LAB — COMPREHENSIVE METABOLIC PANEL
ALT: 16 U/L (ref 0–53)
AST: 18 U/L (ref 0–37)
Albumin: 4.6 g/dL (ref 3.5–5.2)
Alkaline Phosphatase: 44 U/L (ref 39–117)
BUN: 18 mg/dL (ref 6–23)
CO2: 29 mEq/L (ref 19–32)
Calcium: 8.9 mg/dL (ref 8.4–10.5)
Chloride: 104 mEq/L (ref 96–112)
Creatinine, Ser: 1.47 mg/dL (ref 0.40–1.50)
GFR: 54.24 mL/min — ABNORMAL LOW (ref >60.00)
Glucose, Bld: 107 mg/dL — ABNORMAL HIGH (ref 70–99)
Potassium: 4.2 mEq/L (ref 3.5–5.1)
Sodium: 140 mEq/L (ref 135–145)
Total Bilirubin: 0.6 mg/dL (ref 0.2–1.2)
Total Protein: 6.8 g/dL (ref 6.0–8.3)

## 2021-03-10 LAB — VITAMIN B12: Vitamin B-12: 396 pg/mL (ref 211–911)

## 2021-03-10 LAB — PSA: PSA: 0.12 ng/mL (ref 0.10–4.00)

## 2021-03-10 LAB — TSH: TSH: 1.39 u[IU]/mL (ref 0.35–4.50)

## 2021-03-11 LAB — HEPATITIS C ANTIBODY
Hepatitis C Ab: NONREACTIVE
SIGNAL TO CUT-OFF: 0.01 (ref <1.00)

## 2021-03-15 ENCOUNTER — Encounter: Payer: Self-pay | Admitting: Family Medicine

## 2021-03-15 ENCOUNTER — Other Ambulatory Visit: Payer: Self-pay

## 2021-03-15 ENCOUNTER — Ambulatory Visit (INDEPENDENT_AMBULATORY_CARE_PROVIDER_SITE_OTHER): Payer: 59 | Admitting: Family Medicine

## 2021-03-15 VITALS — BP 146/102 | HR 73 | Temp 97.6°F | Ht 67.25 in | Wt 180.1 lb

## 2021-03-15 DIAGNOSIS — E785 Hyperlipidemia, unspecified: Secondary | ICD-10-CM

## 2021-03-15 DIAGNOSIS — R030 Elevated blood-pressure reading, without diagnosis of hypertension: Secondary | ICD-10-CM

## 2021-03-15 DIAGNOSIS — I10 Essential (primary) hypertension: Secondary | ICD-10-CM | POA: Insufficient documentation

## 2021-03-15 DIAGNOSIS — Z0000 Encounter for general adult medical examination without abnormal findings: Secondary | ICD-10-CM

## 2021-03-15 DIAGNOSIS — N529 Male erectile dysfunction, unspecified: Secondary | ICD-10-CM

## 2021-03-15 DIAGNOSIS — G4733 Obstructive sleep apnea (adult) (pediatric): Secondary | ICD-10-CM

## 2021-03-15 DIAGNOSIS — E0500 Thyrotoxicosis with diffuse goiter without thyrotoxic crisis or storm: Secondary | ICD-10-CM | POA: Diagnosis not present

## 2021-03-15 DIAGNOSIS — N138 Other obstructive and reflux uropathy: Secondary | ICD-10-CM

## 2021-03-15 DIAGNOSIS — N401 Enlarged prostate with lower urinary tract symptoms: Secondary | ICD-10-CM | POA: Diagnosis not present

## 2021-03-15 DIAGNOSIS — Z9989 Dependence on other enabling machines and devices: Secondary | ICD-10-CM

## 2021-03-15 DIAGNOSIS — E538 Deficiency of other specified B group vitamins: Secondary | ICD-10-CM

## 2021-03-15 MED ORDER — TADALAFIL 2.5 MG PO TABS: 2.5000 mg | ORAL_TABLET | Freq: Every day | ORAL | 1 refills | Status: DC

## 2021-03-15 NOTE — Progress Notes (Signed)
Patient ID: William Tucker, male    DOB: 1965/12/26, 56 y.o.   MRN: [id number]  This visit was conducted in person.  BP (!) 146/102   Pulse 73   Temp 97.6 F (36.4 C) (Temporal)   Ht 5' 7.25" (1.708 m)   Wt 180 lb 1 oz (81.7 kg)   SpO2 98%   BMI 27.99 kg/m   BP 160/92 on recheck   CC: CPE  Subjective:   HPI: William Tucker is a 55 y.o. 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  Now working at [employer].  Graves disease - sees endo Cruzita Lederer), off medications. OSA on CPAP (Kasa)  BPH - notes ongoing trouble starting urine, nocturia x1. Notes some trouble with ejaculation. Longstanding on flomax/finasteride - unable to taper off flomax due to worsening urinary symptoms. Daily cialis wasn't too helpful. Viagra hasn't helped much either. Desires to return to daily cialis as it was covered by insurance.   Just rejoined gym last week.   Preventative: COLONOSCOPY 02/2018-3 benign polyps, rpt 5 yrs Carlean Purl) Prostate - checked yearly 2/2 alpha blocker and 5a reductase inhibitorfor BPH.  Lung cancer screening - not eligible  Flu shot - declines  Tetanus - 1998, 2011 COVID vaccine - discussed, declines  Shingrix - 01/2020, 03/2020  Seat belt use discussed  Sunscreen use discussed. No changing moles on skin. Sees dermatologistyearly.  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 Interim medical history since our last visit reviewed. Allergies and medications reviewed and updated. Outpatient Medications Prior to Visit  Medication Sig Dispense Refill  . Cholecalciferol (VITAMIN D3 PO) Take by mouth daily.     . cyanocobalamin 1000 MCG tablet Take 1,000 mcg by mouth daily.    . finasteride (PROSCAR) 5 MG tablet TAKE 1 TABLET BY MOUTH  DAILY 90 tablet 0  . loratadine (CLARITIN) 10 MG tablet Take 10 mg by mouth daily.    . Multiple Vitamins-Minerals (ZINC PO) Take by mouth daily.    . tamsulosin (FLOMAX) 0.4 MG CAPS capsule TAKE 1 CAPSULE BY MOUTH  DAILY 90 capsule 0  . sildenafil (VIAGRA) 100 MG tablet TAKE 0.5-1 TABLETS (50-100 MG TOTAL) BY MOUTH DAILY AS NEEDED FOR ERECTILE DYSFUNCTION. 5 tablet 2   No facility-administered medications prior to visit.     Per HPI unless specifically indicated in ROS section below Review of Systems  Constitutional: Positive for appetite change (increased). Negative for activity change, chills, fatigue, fever and unexpected weight change.  HENT: Negative for hearing loss.   Eyes: Positive for visual disturbance (?optic migraine - shimmering vision to left eye).  Respiratory: Negative for cough, chest tightness, shortness of breath and wheezing.   Cardiovascular: Negative for chest pain, palpitations and leg swelling.  Gastrointestinal: Negative for abdominal distention, abdominal pain, blood in stool, constipation, diarrhea, nausea and vomiting.  Genitourinary: Negative for difficulty urinating and hematuria.  Musculoskeletal: Negative for arthralgias, myalgias and neck pain.  Skin: Negative for rash.  Neurological: Negative for dizziness, seizures, syncope and headaches.  Hematological: Negative for adenopathy. Does not bruise/bleed easily.  Psychiatric/Behavioral: Negative for dysphoric mood. The patient is  not nervous/anxious.    Objective:  BP (!) 146/102   Pulse 73   Temp 97.6 F (36.4 C) (Temporal)   Ht 5' 7.25" (1.708 m)   Wt 180 lb 1 oz (81.7 kg)   SpO2 98%   BMI 27.99 kg/m   Wt Readings from Last 3 Encounters:  03/15/21 180 lb 1 oz (81.7 kg)  06/23/20 162 lb (73.5 kg)  04/06/20 177 lb 1 oz (80.3 kg)      Physical Exam Vitals and  nursing note reviewed.  Constitutional:      General: He is not in acute distress.    Appearance: Normal appearance. He is well-developed. He is not ill-appearing.  HENT:     Head: Normocephalic and atraumatic.     Right Ear: Hearing, tympanic membrane, ear canal and external ear normal.     Left Ear: Hearing, tympanic membrane, ear canal and external ear normal.  Eyes:     General: No scleral icterus.    Extraocular Movements: Extraocular movements intact.     Conjunctiva/sclera: Conjunctivae normal.     Pupils: Pupils are equal, round, and reactive to light.  Neck:     Thyroid: No thyroid mass or thyromegaly.  Cardiovascular:     Rate and Rhythm: Normal rate and regular rhythm.     Pulses: Normal pulses.          Radial pulses are 2+ on the right side and 2+ on the left side.     Heart sounds: Normal heart sounds. No murmur heard.   Pulmonary:     Effort: Pulmonary effort is normal. No respiratory distress.     Breath sounds: Normal breath sounds. No wheezing, rhonchi or rales.  Abdominal:     General: Bowel sounds are normal. There is no distension.     Palpations: Abdomen is soft. There is no mass.     Tenderness: There is no abdominal tenderness. There is no guarding or rebound.     Hernia: No hernia is present.  Musculoskeletal:        General: Normal range of motion.     Cervical back: Normal range of motion and neck supple.     Right lower leg: No edema.     Left lower leg: No edema.  Lymphadenopathy:     Cervical: No cervical adenopathy.  Skin:    General: Skin is warm and dry.     Findings: No rash.  Neurological:     General: No focal deficit present.     Mental Status: He is alert and oriented to person, place, and time.     Comments: CN grossly intact, station and gait intact  Psychiatric:        Mood and Affect: Mood normal.        Behavior: Behavior normal.        Thought Content: Thought content normal.        Judgment: Judgment normal.        Results for orders placed or performed in visit on 03/10/21  Hepatitis C antibody  Result Value Ref Range   Hepatitis C Ab NON-REACTIVE NON-REACTIVE   SIGNAL TO CUT-OFF 0.01 <1.00  PSA  Result Value Ref Range   PSA 0.12 0.10 - 4.00 ng/mL  Vitamin B12  Result Value Ref Range   Vitamin B-12 396 211 - 911 pg/mL  Comprehensive metabolic panel  Result Value Ref Range   Sodium 140 135 - 145 mEq/L   Potassium 4.2 3.5 - 5.1 mEq/L  Chloride 104 96 - 112 mEq/L   CO2 29 19 - 32 mEq/L   Glucose, Bld 107 (H) 70 - 99 mg/dL   BUN 18 6 - 23 mg/dL   Creatinine, Ser 1.47 0.40 - 1.50 mg/dL   Total Bilirubin 0.6 0.2 - 1.2 mg/dL   Alkaline Phosphatase 44 39 - 117 U/L   AST 18 0 - 37 U/L   ALT 16 0 - 53 U/L   Total Protein 6.8 6.0 - 8.3 g/dL   Albumin 4.6 3.5 - 5.2 g/dL   GFR 54.24 (L) >60.00 mL/min   Calcium 8.9 8.4 - 10.5 mg/dL  TSH  Result Value Ref Range   TSH 1.39 0.35 - 4.50 uIU/mL  Lipid panel  Result Value Ref Range   Cholesterol 171 0 - 200 mg/dL   Triglycerides 102.0 0.0 - 149.0 mg/dL   HDL 58.90 >39.00 mg/dL   VLDL 20.4 0.0 - 40.0 mg/dL   LDL Cholesterol 91 0 - 99 mg/dL   Total CHOL/HDL Ratio 3    NonHDL 111.80    Assessment & Plan:  This visit occurred during the SARS-CoV-2 public health emergency.  Safety protocols were in place, including screening questions prior to the visit, additional usage of staff PPE, and extensive cleaning of exam room while observing appropriate contact time as indicated for disinfecting solutions.   Problem List Items Addressed This Visit    Graves disease (Chronic)    Stable period off medication. Sees endo      HLD (hyperlipidemia)    Chronic, stable off medications. The 10-year ASCVD risk score Mikey Bussing DC Brooke Bonito., et al., 2013) is: 4.1%   Values used to calculate the score:     Age: 30 years     Sex: Male     Is Non-Hispanic African American: No     Diabetic: No     Tobacco smoker: No     Systolic Blood Pressure: 109 mmHg     Is BP  treated: No     HDL Cholesterol: 58.9 mg/dL     Total Cholesterol: 171 mg/dL       Relevant Medications   tadalafil 2.5 MG TABS   Benign prostatic hyperplasia with urinary obstruction    Chronic, stable. Continue current regimen of finasteride and flomax. Restart cialis 2.5mg  daily in place of viagra.  IPS-S = 7-2      Healthcare maintenance - Primary    Preventative protocols reviewed and updated unless pt declined. Discussed healthy diet and lifestyle.       Vitamin B12 deficiency    Continue vit B12 supplementation      OSA on CPAP    Continue CPAP use (Kasa)      Erectile dysfunction    Restart daily cialis      Elevated blood pressure reading without diagnosis of hypertension    bp elevated today. rec low sodium diet. DASH diet handout provided.  Advised start monitoring BP at home, keep log and bring in to review in 1-2 months.           Meds ordered this encounter  Medications  . tadalafil 2.5 MG TABS    Sig: Take 1 tablet (2.5 mg total) by mouth daily.    Dispense:  90 tablet    Refill:  1   No orders of the defined types were placed in this encounter.   Patient instructions: Restart cialis.  Blood pressures were too high today  Start monitoring at home and keep log for next  visit. Your goal blood pressure is >140/90.  Work on low salt/sodium diet - goal <1.5gm (1,500mg ) per day. Eat a diet high in fruits/vegetables and whole grains.  Look into mediterranean and DASH diet. Goal activity is 165min/wk of moderate intensity exercise.  This can be split into 30 minute chunks.  If you are not at this level, you can start with smaller 10-15 min increments and slowly build up activity. Look at [employer] for more resources  Return as needed or in 1-2 months for hypertension follow up.   Follow up plan: Return in about 1 year (around 03/15/2022) for follow up visit, annual exam, prior fasting for blood work.  Ria Bush, MD

## 2021-03-15 NOTE — Assessment & Plan Note (Addendum)
Chronic, stable. Continue current regimen of finasteride and flomax. Restart cialis 2.5mg  daily in place of viagra.  IPS-S = 7-2

## 2021-03-15 NOTE — Assessment & Plan Note (Signed)
Restart daily cialis

## 2021-03-15 NOTE — Assessment & Plan Note (Signed)
Continue CPAP use (Kasa)

## 2021-03-15 NOTE — Patient Instructions (Addendum)
Restart cialis.  Blood pressures were too high today  Start monitoring at home and keep log for next visit. Your goal blood pressure is >140/90.  Work on low salt/sodium diet - goal <1.5gm (1,500mg ) per day. Eat a diet high in fruits/vegetables and whole grains.  Look into mediterranean and DASH diet. Goal activity is 111min/wk of moderate intensity exercise.  This can be split into 30 minute chunks.  If you are not at this level, you can start with smaller 10-15 min increments and slowly build up activity. Look at [place].org for more resources  Return as needed or in 1-2 months for hypertension follow up.   [email or web address]">  DASH Eating Plan DASH stands for Dietary Approaches to Stop Hypertension. The DASH eating plan is a healthy eating plan that has been shown to:  Reduce high blood pressure (hypertension).  Reduce your risk for type 2 diabetes, heart disease, and stroke.  Help with weight loss. What are tips for following this plan? Reading food labels  Check food labels for the amount of salt (sodium) per serving. Choose foods with less than 5 percent of the Daily Value of sodium. Generally, foods with less than 300 milligrams (mg) of sodium per serving fit into this eating plan.  To find whole grains, look for the word "whole" as the first word in the ingredient list. Shopping  Buy products labeled as "low-sodium" or "no salt added."  Buy fresh foods. Avoid canned foods and pre-made or frozen meals. Cooking  Avoid adding salt when cooking. Use salt-free seasonings or herbs instead of table salt or sea salt. Check with your health care provider or pharmacist before using salt substitutes.  Do not fry foods. Cook foods using healthy methods such as baking, boiling, grilling, roasting, and broiling instead.  Cook with heart-healthy oils, such as olive, canola, avocado, soybean, or sunflower oil. Meal planning  Eat a  balanced diet that includes: ? 4 or more servings of fruits and 4 or more servings of vegetables each day. Try to fill one-half of your plate with fruits and vegetables. ? 6-8 servings of whole grains each day. ? Less than 6 oz (170 g) of lean meat, poultry, or fish each day. A 3-oz (85-g) serving of meat is about the same size as a deck of cards. One egg equals 1 oz (28 g). ? 2-3 servings of low-fat dairy each day. One serving is 1 cup (237 mL). ? 1 serving of nuts, seeds, or beans 5 times each week. ? 2-3 servings of heart-healthy fats. Healthy fats called omega-3 fatty acids are found in foods such as walnuts, flaxseeds, fortified milks, and eggs. These fats are also found in cold-water fish, such as sardines, salmon, and mackerel.  Limit how much you eat of: ? Canned or prepackaged foods. ? Food that is high in trans fat, such as some fried foods. ? Food that is high in saturated fat, such as fatty meat. ? Desserts and other sweets, sugary drinks, and other foods with added sugar. ? Full-fat dairy products.  Do not salt foods before eating.  Do not eat more than 4 egg yolks a week.  Try to eat at least 2 vegetarian meals a week.  Eat more home-cooked food and less restaurant, buffet, and fast food.   Lifestyle  When eating at a restaurant, ask that your food be prepared with less salt or no salt, if possible.  If you drink alcohol: ? Limit how much you use to:  0-1 drink  a day for women who are not pregnant.  0-2 drinks a day for men. ? Be aware of how much alcohol is in your drink. In the [place] one drink equals one 12 oz bottle of beer (355 mL), one 5 oz glass of wine (148 mL), or one 1 oz glass of hard liquor (44 mL). General information  Avoid eating more than 2,300 mg of salt a day. If you have hypertension, you may need to reduce your sodium intake to 1,500 mg a day.  Work with your health care provider to maintain a healthy body weight or to lose weight. Ask what an  ideal weight is for you.  Get at least 30 minutes of exercise that causes your heart to beat faster (aerobic exercise) most days of the week. Activities may include walking, swimming, or biking.  Work with your health care provider or dietitian to adjust your eating plan to your individual calorie needs. What foods should I eat? Fruits All fresh, dried, or frozen fruit. Canned fruit in natural juice (without added sugar). Vegetables Fresh or frozen vegetables (raw, steamed, roasted, or grilled). Low-sodium or reduced-sodium tomato and vegetable juice. Low-sodium or reduced-sodium tomato sauce and tomato paste. Low-sodium or reduced-sodium canned vegetables. Grains Whole-grain or whole-wheat bread. Whole-grain or whole-wheat pasta. Brown rice. [place]. Bulgur. Whole-grain and low-sodium cereals. Pita bread. Low-fat, low-sodium crackers. Whole-wheat flour tortillas. Meats and other proteins Skinless chicken or [place]. Ground chicken or [place]. Pork with fat trimmed off. Fish and seafood. Egg whites. Dried beans, peas, or lentils. Unsalted nuts, nut butters, and seeds. Unsalted canned beans. Lean cuts of beef with fat trimmed off. Low-sodium, lean precooked or cured meat, such as sausages or meat loaves. Dairy Low-fat (1%) or fat-free (skim) milk. Reduced-fat, low-fat, or fat-free cheeses. Nonfat, low-sodium ricotta or cottage cheese. Low-fat or nonfat yogurt. Low-fat, low-sodium cheese. Fats and oils Soft margarine without trans fats. Vegetable oil. Reduced-fat, low-fat, or light mayonnaise and salad dressings (reduced-sodium). Canola, safflower, olive, avocado, soybean, and sunflower oils. Avocado. Seasonings and condiments Herbs. Spices. Seasoning mixes without salt. Other foods Unsalted popcorn and pretzels. Fat-free sweets. The items listed above may not be a complete list of foods and beverages you can eat. Contact a dietitian for more information. What foods should I  avoid? Fruits Canned fruit in a light or heavy syrup. Fried fruit. Fruit in cream or butter sauce. Vegetables Creamed or fried vegetables. Vegetables in a cheese sauce. Regular canned vegetables (not low-sodium or reduced-sodium). Regular canned tomato sauce and paste (not low-sodium or reduced-sodium). Regular tomato and vegetable juice (not low-sodium or reduced-sodium). Angie Fava. Olives. Grains Baked goods made with fat, such as croissants, muffins, or some breads. Dry pasta or rice meal packs. Meats and other proteins Fatty cuts of meat. Ribs. Fried meat. Berniece Salines. Bologna, salami, and other precooked or cured meats, such as sausages or meat loaves. Fat from the back of a pig (fatback). Bratwurst. Salted nuts and seeds. Canned beans with added salt. Canned or smoked fish. Whole eggs or egg yolks. Chicken or [place] with skin. Dairy Whole or 2% milk, cream, and half-and-half. Whole or full-fat cream cheese. Whole-fat or sweetened yogurt. Full-fat cheese. Nondairy creamers. Whipped toppings. Processed cheese and cheese spreads. Fats and oils Butter. Stick margarine. Lard. Shortening. Ghee. Bacon fat. Tropical oils, such as coconut, palm kernel, or palm oil. Seasonings and condiments Onion salt, garlic salt, seasoned salt, table salt, and sea salt. Worcestershire sauce. Tartar sauce. Barbecue sauce. Teriyaki sauce. Soy sauce, including reduced-sodium.  Steak sauce. Canned and packaged gravies. Fish sauce. Oyster sauce. Cocktail sauce. Store-bought horseradish. Ketchup. Mustard. Meat flavorings and tenderizers. Bouillon cubes. Hot sauces. Pre-made or packaged marinades. Pre-made or packaged taco seasonings. Relishes. Regular salad dressings. Other foods Salted popcorn and pretzels. The items listed above may not be a complete list of foods and beverages you should avoid. Contact a dietitian for more information. Where to find more information  National Heart, Lung, and Blood Institute:  https://wilson-eaton.com/  American Heart Association: www.heart.org  Academy of Nutrition and Dietetics: www.eatright.[place]: www.kidney.org Summary  The DASH eating plan is a healthy eating plan that has been shown to reduce high blood pressure (hypertension). It may also reduce your risk for type 2 diabetes, heart disease, and stroke.  When on the DASH eating plan, aim to eat more fresh fruits and vegetables, whole grains, lean proteins, low-fat dairy, and heart-healthy fats.  With the DASH eating plan, you should limit salt (sodium) intake to 2,300 mg a day. If you have hypertension, you may need to reduce your sodium intake to 1,500 mg a day.  Work with your health care provider or dietitian to adjust your eating plan to your individual calorie needs. This information is not intended to replace advice given to you by your health care provider. Make sure you discuss any questions you have with your health care provider. Document Revised: 09/12/2019 Document Reviewed: 09/12/2019 Elsevier Patient Education  2021 [place] Maintenance, Male Adopting a healthy lifestyle and getting preventive care are important in promoting health and wellness. Ask your health care provider about:  The right schedule for you to have regular tests and exams.  Things you can do on your own to prevent diseases and keep yourself healthy. What should I know about diet, weight, and exercise? Eat a healthy diet  Eat a diet that includes plenty of vegetables, fruits, low-fat dairy products, and lean protein.  Do not eat a lot of foods that are high in solid fats, added sugars, or sodium.   Maintain a healthy weight Body mass index (BMI) is a measurement that can be used to identify possible weight problems. It estimates body fat based on height and weight. Your health care provider can help determine your BMI and help you achieve or maintain a healthy weight. Get regular  exercise Get regular exercise. This is one of the most important things you can do for your health. Most adults should:  Exercise for at least 150 minutes each week. The exercise should increase your heart rate and make you sweat (moderate-intensity exercise).  Do strengthening exercises at least twice a week. This is in addition to the moderate-intensity exercise.  Spend less time sitting. Even light physical activity can be beneficial. Watch cholesterol and blood lipids Have your blood tested for lipids and cholesterol at 53 years of age, then have this test every 5 years. You may need to have your cholesterol levels checked more often if:  Your lipid or cholesterol levels are high.  You are older than 53 years of age.  You are at high risk for heart disease. What should I know about cancer screening? Many types of cancers can be detected early and may often be prevented. Depending on your health history and family history, you may need to have cancer screening at various ages. This may include screening for:  Colorectal cancer.  Prostate cancer.  Skin cancer.  Lung cancer. What should I know about heart disease, diabetes,  and high blood pressure? Blood pressure and heart disease  High blood pressure causes heart disease and increases the risk of stroke. This is more likely to develop in people who have high blood pressure readings, are of African descent, or are overweight.  Talk with your health care provider about your target blood pressure readings.  Have your blood pressure checked: ? Every 3-5 years if you are 19-49 years of age. ? Every year if you are 26 years old or older.  If you are between the ages of 26 and 24 and are a current or former smoker, ask your health care provider if you should have a one-time screening for abdominal aortic aneurysm (AAA). Diabetes Have regular diabetes screenings. This checks your fasting blood sugar level. Have the screening  done:  Once every three years after age 76 if you are at a normal weight and have a low risk for diabetes.  More often and at a younger age if you are overweight or have a high risk for diabetes. What should I know about preventing infection? Hepatitis B If you have a higher risk for hepatitis B, you should be screened for this virus. Talk with your health care provider to find out if you are at risk for hepatitis B infection. Hepatitis C Blood testing is recommended for:  Everyone born from [birth year] through 1963.  Anyone with known risk factors for hepatitis C. Sexually transmitted infections (STIs)  You should be screened each year for STIs, including gonorrhea and chlamydia, if: ? You are sexually active and are younger than 53 years of age. ? You are older than 53 years of age and your health care provider tells you that you are at risk for this type of infection. ? Your sexual activity has changed since you were last screened, and you are at increased risk for chlamydia or gonorrhea. Ask your health care provider if you are at risk.  Ask your health care provider about whether you are at high risk for HIV. Your health care provider may recommend a prescription medicine to help prevent HIV infection. If you choose to take medicine to prevent HIV, you should first get tested for HIV. You should then be tested every 3 months for as long as you are taking the medicine. Follow these instructions at home: Lifestyle  Do not use any products that contain nicotine or tobacco, such as cigarettes, e-cigarettes, and chewing tobacco. If you need help quitting, ask your health care provider.  Do not use street drugs.  Do not share needles.  Ask your health care provider for help if you need support or information about quitting drugs. Alcohol use  Do not drink alcohol if your health care provider tells you not to drink.  If you drink alcohol: ? Limit how much you have to 0-2 drinks a  day. ? Be aware of how much alcohol is in your drink. In the [place] one drink equals one 12 oz bottle of beer (355 mL), one 5 oz glass of wine (148 mL), or one 1 oz glass of hard liquor (44 mL). General instructions  Schedule regular health, dental, and eye exams.  Stay current with your vaccines.  Tell your health care provider if: ? You often feel depressed. ? You have ever been abused or do not feel safe at home. Summary  Adopting a healthy lifestyle and getting preventive care are important in promoting health and wellness.  Follow your health care provider's instructions about healthy diet,  exercising, and getting tested or screened for diseases.  Follow your health care provider's instructions on monitoring your cholesterol and blood pressure. This information is not intended to replace advice given to you by your health care provider. Make sure you discuss any questions you have with your health care provider. Document Revised: 10/02/2018 Document Reviewed: 10/02/2018 Elsevier Patient Education  2021 [employer].

## 2021-03-15 NOTE — Assessment & Plan Note (Signed)
Preventative protocols reviewed and updated unless pt declined. Discussed healthy diet and lifestyle.  

## 2021-03-15 NOTE — Assessment & Plan Note (Signed)
Chronic, stable off medications. The 10-year ASCVD risk score Mikey Bussing DC Brooke Bonito., et al., 2013) is: 4.1%   Values used to calculate the score:     Age: 55 years     Sex: Male     Is Non-Hispanic African American: No     Diabetic: No     Tobacco smoker: No     Systolic Blood Pressure: 031 mmHg     Is BP treated: No     HDL Cholesterol: 58.9 mg/dL     Total Cholesterol: 171 mg/dL

## 2021-03-15 NOTE — Assessment & Plan Note (Signed)
Stable period off medication. Sees endo

## 2021-03-15 NOTE — Assessment & Plan Note (Signed)
bp elevated today. rec low sodium diet. DASH diet handout provided.  Advised start monitoring BP at home, keep log and bring in to review in 1-2 months.

## 2021-03-15 NOTE — Assessment & Plan Note (Signed)
Continue vit B12 supplementation

## 2021-04-12 ENCOUNTER — Telehealth: Payer: Self-pay | Admitting: Family Medicine

## 2021-04-12 MED ORDER — AMLODIPINE BESYLATE 2.5 MG PO TABS: 2.5000 mg | ORAL_TABLET | Freq: Every day | ORAL | 11 refills | Status: DC

## 2021-04-12 NOTE — Addendum Note (Signed)
Addended by: Ria Bush on: 04/12/2021 02:10 PM   Modules accepted: Orders

## 2021-04-12 NOTE — Telephone Encounter (Signed)
Pt came in office to drop off blood pressure tracking card. Placed in provider folder

## 2021-04-12 NOTE — Telephone Encounter (Signed)
Placed in Dr. G's box.  

## 2021-04-12 NOTE — Telephone Encounter (Signed)
Log reviewed: BP 130-144/82-102 HR 60-80  Improved but staying borderline high.  I think he would benefit from low dose BP med - would like him to start amlodipine 2.5mg  daily.  In interim continue working on lifestyle/diet choices to control blood pressures.  Let us know if any trouble tolerating med.

## 2021-04-12 NOTE — Telephone Encounter (Signed)
Left message on vm per dpr relaying Dr. G's message.  

## 2021-04-13 NOTE — Telephone Encounter (Signed)
Possibly side effect to amlodipine given temporally related to starting the BP med.  Would suggest continue medication, monitoring for this symptom. If ongoing or worsening, stop med and let us know.

## 2021-04-13 NOTE — Telephone Encounter (Signed)
Patient advised. Patient noted today that after taking Amlodipine today about an hour after he was sitting down and noticed that his legs were shaking some but when he stood up and walked around he did not have any sensation of shaking. It lasted for a few minutes. Patient did not have any other symptoms at all, no syncope feeling or anything. The movement of the legs reminded patient of like when you are cold and shivering but he was not cold. He feels ok now just wanted to mention this. No symptoms of concern currently.  He took his first dose of Amlodipine yesterday and the second time today. B/P this morning before taking medication was 155/92. Patient is not able to take his b/p right now.  Please advise. Thank you.

## 2021-04-13 NOTE — Telephone Encounter (Signed)
Spoke with pt relaying Dr. G's message. Pt verbalizes understanding.  

## 2021-04-18 ENCOUNTER — Other Ambulatory Visit: Payer: Self-pay | Admitting: Family Medicine

## 2021-04-29 ENCOUNTER — Telehealth: Payer: Self-pay | Admitting: Family Medicine

## 2021-04-29 MED ORDER — AMLODIPINE BESYLATE 5 MG PO TABS: 5.0000 mg | ORAL_TABLET | Freq: Every day | ORAL | 11 refills | Status: DC

## 2021-04-29 NOTE — Telephone Encounter (Addendum)
Log reviewed - BP 131-148/80-98, HR 70s.   BP running a bit high - recommend he increase amlodipine dose to 5mg  daily - double up until he runs out, new dose will be at pharmacy. Let us know if any trouble tolerating higher dose.

## 2021-04-29 NOTE — Telephone Encounter (Signed)
William Tucker, William Tucker wife [name] dropped off [name]'s blood pressure log.

## 2021-04-29 NOTE — Addendum Note (Signed)
Addended by: Ria Bush on: 04/29/2021 05:20 PM   Modules accepted: Orders

## 2021-04-29 NOTE — Telephone Encounter (Signed)
Placed in Dr. G's box.  

## 2021-05-02 NOTE — Telephone Encounter (Signed)
Left VM requesting pt to call the office back 

## 2021-05-11 NOTE — Telephone Encounter (Signed)
Left message on vm per dpr relaying Dr. G's message.  

## 2021-05-25 ENCOUNTER — Encounter: Payer: Self-pay | Admitting: Family Medicine

## 2021-05-25 ENCOUNTER — Other Ambulatory Visit: Payer: Self-pay

## 2021-05-25 ENCOUNTER — Ambulatory Visit: Payer: 59 | Admitting: Family Medicine

## 2021-05-25 ENCOUNTER — Telehealth: Payer: Self-pay | Admitting: *Deleted

## 2021-05-25 VITALS — BP 148/90 | HR 78 | Temp 97.8°F | Ht 67.25 in | Wt 181.3 lb

## 2021-05-25 DIAGNOSIS — R21 Rash and other nonspecific skin eruption: Secondary | ICD-10-CM

## 2021-05-25 DIAGNOSIS — I10 Essential (primary) hypertension: Secondary | ICD-10-CM

## 2021-05-25 MED ORDER — LISINOPRIL 20 MG PO TABS: 20.0000 mg | ORAL_TABLET | Freq: Every day | ORAL | 6 refills | Status: DC

## 2021-05-25 MED ORDER — TRIAMCINOLONE ACETONIDE 0.1 % EX CREA: 1.0000 "application " | TOPICAL_CREAM | Freq: Two times a day (BID) | CUTANEOUS | 0 refills | Status: DC

## 2021-05-25 NOTE — Telephone Encounter (Signed)
Left message on voicemail for patient to call the office back. 

## 2021-05-25 NOTE — Telephone Encounter (Signed)
Spoke to patient by telephone and was advised that he can be here today for an appointment at 12:30. Patient had a negative covid screening.

## 2021-05-25 NOTE — Patient Instructions (Addendum)
Go ahead and stop amlodipine.  In its place start lisinopril '20mg'$  daily. Return in 1-2 weeks for lab visit only to check kidneys. Watch for dry cough side effect or lip swelling allergy.  Keep me updated on how blood pressures are doing on new medicine.  For rash - may use hydoxyzine for itch, may use triamcinolone steroid cream as needed as well.

## 2021-05-25 NOTE — Assessment & Plan Note (Addendum)
BP improved however concern for relation of amlodipine with new skin rash. Will stop amlodipine, start lisinopril '20mg'$  daily in am. Reviewed monitoring for dry cough and ACEI induced angioedema. RTC 1-2 wks after starting ACEI for lab visit. Pt agrees with plan. He will also keep me updated on BP effect.  rec start lisinopril at 1/2 tab for first 2 days.

## 2021-05-25 NOTE — Telephone Encounter (Signed)
Patient left a voicemail stating that he was put on a blood pressure medication and it is bringing his blood pressure down. Patient stated that has developed a rash at his belt line and upper thigh. Patient stated that he is wondering if the rash may be caused by the blood pressure medication. Called to get more information from patient but got his voicemail. Message left on voicemail for patient to call the office back.

## 2021-05-25 NOTE — Telephone Encounter (Signed)
Patient called back stating that he was on Amlodipine 2.5 mg but it was not working. Patient stated that the Amlodipine was increased to 5 mg. Patient stated that he had a sunburn around his belt line about a month ago and thought that was the reason for the rash. Patient stated even when the sunburn cleared up he still had the rash and itching. Patient stated that he started with the rash on his thigh about 2 weeks ago. Patient stated that he took benadryl last night and it helped some. Patient stated that he did not take his blood pressure medication this morning.  Pharmacy [facility]

## 2021-05-25 NOTE — Progress Notes (Signed)
Patient ID: William Tucker, male    DOB: 1966/08/26, 55 y.o.   MRN: [id number]  This visit was conducted in person.  BP (!) 148/90   Pulse 78   Temp 97.8 F (36.6 C) (Temporal)   Ht 5' 7.25" (1.708 m)   Wt 181 lb 5 oz (82.2 kg)   SpO2 96%   BMI 28.19 kg/m   BP Readings from Last 3 Encounters:  05/25/21 (!) 148/90  03/15/21 (!) 146/102  06/23/20 138/70    CC: check rash Subjective:   HPI: William Tucker is a 55 y.o. male presenting on 05/25/2021 for Rash (C/o rash around waist and upper thigh.  Noticed about 2 wks ago.  Area is itchy. )   Amlodipine 2.'5mg'$  started 03/2021 due to persistently mildly elevated blood pressure readings, then increased to '5mg'$  04/2021 due to persistently elevated readings. BP has been running better - 135-140/90.   Sunburn around belt line last month - ?missed applying sunscreen to that area of skin.  Persistent rash developed at site and into thighs.  No new lotions, detergents, soaps or shampoos.  No new supplements, vitamins. No new foods. No oral lesions, no fever, nausea, new joint pains, headache.   Also with ?chronic cyst to left mid back - did drain a few weeks ago.   Wonders if amlodipine related - did not take BP med this morning.  He tried hydroxyzine last night with benefit.      Relevant past medical, surgical, family and social history reviewed and updated as indicated. Interim medical history since our last visit reviewed. Allergies and medications reviewed and updated. Outpatient Medications Prior to Visit  Medication Sig Dispense Refill   Cholecalciferol (VITAMIN D3 PO) Take by mouth daily.     cyanocobalamin 1000 MCG tablet Take 1,000 mcg by mouth daily.     finasteride (PROSCAR) 5 MG tablet TAKE 1 TABLET BY MOUTH  DAILY 90 tablet 3   loratadine (CLARITIN) 10 MG tablet Take 10 mg by mouth daily.     Multiple Vitamins-Minerals (ZINC PO) Take by mouth daily.     tadalafil 2.5 MG TABS Take 1 tablet (2.5 mg total) by mouth  daily. 90 tablet 1   tamsulosin (FLOMAX) 0.4 MG CAPS capsule TAKE 1 CAPSULE BY MOUTH  DAILY 90 capsule 3   amLODipine (NORVASC) 5 MG tablet Take 1 tablet (5 mg total) by mouth daily. 30 tablet 11   No facility-administered medications prior to visit.     Per HPI unless specifically indicated in ROS section below Review of Systems  Objective:  BP (!) 148/90   Pulse 78   Temp 97.8 F (36.6 C) (Temporal)   Ht 5' 7.25" (1.708 m)   Wt 181 lb 5 oz (82.2 kg)   SpO2 96%   BMI 28.19 kg/m   Wt Readings from Last 3 Encounters:  05/25/21 181 lb 5 oz (82.2 kg)  03/15/21 180 lb 1 oz (81.7 kg)  06/23/20 162 lb (73.5 kg)      Physical Exam Vitals and nursing note reviewed.  Constitutional:      Appearance: Normal appearance. He is not ill-appearing.  Musculoskeletal:        General: Normal range of motion.     Right lower leg: No edema.     Left lower leg: No edema.  Skin:    General: Skin is warm and dry.     Capillary Refill: Capillary refill takes less than 2 seconds.  Findings: Rash present. No erythema.          Comments:  Pruritic patches to lower abd at belt line as well as anterior thighs with developing lichenification  Dry patch to right mid back with scabbing present, no obvious cyst or pore  Neurological:     Mental Status: He is alert.  Psychiatric:        Mood and Affect: Mood normal.        Behavior: Behavior normal.      Results for orders placed or performed in visit on 03/10/21  Hepatitis C antibody  Result Value Ref Range   Hepatitis C Ab NON-REACTIVE NON-REACTIVE   SIGNAL TO CUT-OFF 0.01 <1.00  PSA  Result Value Ref Range   PSA 0.12 0.10 - 4.00 ng/mL  Vitamin B12  Result Value Ref Range   Vitamin B-12 396 211 - 911 pg/mL  Comprehensive metabolic panel  Result Value Ref Range   Sodium 140 135 - 145 mEq/L   Potassium 4.2 3.5 - 5.1 mEq/L   Chloride 104 96 - 112 mEq/L   CO2 29 19 - 32 mEq/L   Glucose, Bld 107 (H) 70 - 99 mg/dL   BUN 18 6 - 23  mg/dL   Creatinine, Ser 1.47 0.40 - 1.50 mg/dL   Total Bilirubin 0.6 0.2 - 1.2 mg/dL   Alkaline Phosphatase 44 39 - 117 U/L   AST 18 0 - 37 U/L   ALT 16 0 - 53 U/L   Total Protein 6.8 6.0 - 8.3 g/dL   Albumin 4.6 3.5 - 5.2 g/dL   GFR 54.24 (L) >60.00 mL/min   Calcium 8.9 8.4 - 10.5 mg/dL  TSH  Result Value Ref Range   TSH 1.39 0.35 - 4.50 uIU/mL  Lipid panel  Result Value Ref Range   Cholesterol 171 0 - 200 mg/dL   Triglycerides 102.0 0.0 - 149.0 mg/dL   HDL 58.90 >39.00 mg/dL   VLDL 20.4 0.0 - 40.0 mg/dL   LDL Cholesterol 91 0 - 99 mg/dL   Total CHOL/HDL Ratio 3    NonHDL 111.80     Assessment & Plan:  This visit occurred during the SARS-CoV-2 public health emergency.  Safety protocols were in place, including screening questions prior to the visit, additional usage of staff PPE, and extensive cleaning of exam room while observing appropriate contact time as indicated for disinfecting solutions.   Problem List Items Addressed This Visit     Hypertension    BP improved however concern for relation of amlodipine with new skin rash. Will stop amlodipine, start lisinopril '20mg'$  daily in am. Reviewed monitoring for dry cough and ACEI induced angioedema. RTC 1-2 wks after starting ACEI for lab visit. Pt agrees with plan. He will also keep me updated on BP effect.  rec start lisinopril at 1/2 tab for first 2 days.        Relevant Medications   lisinopril (ZESTRIL) 20 MG tablet   Skin rash - Primary    Itchy skin with developing lichenification.  Unclear cause, ?amlodipine related - although amlodipine is not typically known to cause photosensitivity. Will trial ACEI instead.  rec hydroxyzine (has at home) and TCI cream PRN rash.  H/o psoriasis, however this seems different. Not consistent with tinea.          Meds ordered this encounter  Medications   lisinopril (ZESTRIL) 20 MG tablet    Sig: Take 1 tablet (20 mg total) by mouth daily.    Dispense:  30 tablet    Refill:   6    In place of amlodipine   triamcinolone cream (KENALOG) 0.1 %    Sig: Apply 1 application topically 2 (two) times daily. Apply to AA.    Dispense:  80 g    Refill:  0   No orders of the defined types were placed in this encounter.   Patient Instructions  Go ahead and stop amlodipine.  In its place start lisinopril '20mg'$  daily. Return in 1-2 weeks for lab visit only to check kidneys. Watch for dry cough side effect or lip swelling allergy.  Keep me updated on how blood pressures are doing on new medicine.  For rash - may use hydoxyzine for itch, may use triamcinolone steroid cream as needed as well.   Follow up plan: Return if symptoms worsen or fail to improve.  Ria Bush, MD

## 2021-05-25 NOTE — Telephone Encounter (Signed)
Rec OV to evaluate rash can he come in at 12:30pm today?

## 2021-05-25 NOTE — Assessment & Plan Note (Addendum)
Itchy skin with developing lichenification.  Unclear cause, ?amlodipine related - although amlodipine is not typically known to cause photosensitivity. Will trial ACEI instead.  rec hydroxyzine (has at home) and TCI cream PRN rash.  H/o psoriasis, however this seems different. Not consistent with tinea.

## 2021-06-05 ENCOUNTER — Other Ambulatory Visit: Payer: Self-pay | Admitting: Family Medicine

## 2021-06-05 DIAGNOSIS — R21 Rash and other nonspecific skin eruption: Secondary | ICD-10-CM

## 2021-06-05 DIAGNOSIS — I10 Essential (primary) hypertension: Secondary | ICD-10-CM

## 2021-06-05 NOTE — Progress Notes (Signed)
Brings BP log from the past 2 months showing BP ranging 115-145/70-90s, more recently 130-140/70-80s, HR 60-80s.

## 2021-06-06 ENCOUNTER — Telehealth: Payer: Self-pay | Admitting: *Deleted

## 2021-06-06 MED ORDER — LOSARTAN POTASSIUM 50 MG PO TABS: 50.0000 mg | ORAL_TABLET | Freq: Every day | ORAL | 1 refills | Status: DC

## 2021-06-06 NOTE — Telephone Encounter (Signed)
Spoke with pt relaying Dr. G's message. Pt verbalizes understanding.  

## 2021-06-06 NOTE — Telephone Encounter (Signed)
Patient called stating that his blood pressure medication was changed recently because of a rash. Patient stated the new medication has caused more itching on his arms and head  Patient stated this is worse than what the other medication was causing. Patient stated Saturday and Sunday he only look half of the Lisinopril and he was still having the itching. Patient stated that he also has noticed that his food has felt stuck in his chest several times since the medication change which may be coincidental. Patient stated since the change in the medication he has had chills/cool feeling that goes over his body at times.. Patient stated that he has not taken the Lisinopril today. Patient stated his blood pressure this morning was 135/85. Patient denies that he has had any problems with breathing or throat swelling.

## 2021-06-06 NOTE — Telephone Encounter (Signed)
Stop lisinopril.  In its place may try losartan '50mg'$  daily - sent to pharmacy.

## 2021-06-08 ENCOUNTER — Other Ambulatory Visit (INDEPENDENT_AMBULATORY_CARE_PROVIDER_SITE_OTHER): Payer: 59

## 2021-06-08 ENCOUNTER — Other Ambulatory Visit: Payer: Self-pay

## 2021-06-08 DIAGNOSIS — I10 Essential (primary) hypertension: Secondary | ICD-10-CM | POA: Diagnosis not present

## 2021-06-08 LAB — RENAL FUNCTION PANEL
Albumin: 4.3 g/dL (ref 3.5–5.2)
BUN: 17 mg/dL (ref 6–23)
CO2: 26 mEq/L (ref 19–32)
Calcium: 8.9 mg/dL (ref 8.4–10.5)
Chloride: 104 mEq/L (ref 96–112)
Creatinine, Ser: 1.4 mg/dL (ref 0.40–1.50)
GFR: 57.41 mL/min — ABNORMAL LOW (ref >60.00)
Glucose, Bld: 99 mg/dL (ref 70–99)
Phosphorus: 2.4 mg/dL (ref 2.3–4.6)
Potassium: 3.9 mEq/L (ref 3.5–5.1)
Sodium: 138 mEq/L (ref 135–145)

## 2021-06-13 ENCOUNTER — Telehealth: Payer: Self-pay | Admitting: *Deleted

## 2021-06-13 NOTE — Telephone Encounter (Signed)
Patient called stating the last medication Losartan that he was given caused him to itch also. Patient stated he decided to try and get all of the medication out of his sytem. Patient stated that he did not take any blood pressure medication Friday, Saturday or Sunday. Patient stated Losartan caused his arms, head, neck and chest to itch. Patient stated his blood pressure Friday was 149/101, Saturday 135/86. Patient stated he did go ahead and take 1/2 of Amlodipine last night because of all the medications given caused the least amount of itching. Patient stated his blood pressure this morning was 136/92 heart rate 72. Patient wants to know what Dr. Danise Mina thinks he should do at this point. Patient stated he did have a headache yesterday but denies a headache today Patient denies any chest pain. Pharmacy [facility]

## 2021-06-13 NOTE — Telephone Encounter (Signed)
Ok - let's continue amlodipine 1/2 tab for the next week to see how tolerated, then would suggest increasing to full dose '5mg'$  daily, update [place] with effect and tolerance. I have added lisinopril and losartan to allergy list as they caused itchy and lisinopril caused rash.

## 2021-06-13 NOTE — Telephone Encounter (Signed)
Patient called and given information about Rx recommendations per Dr. Danise Mina. Patient stated understanding and appreciation for the call and the movement of lisinopril and losartan to his allergy list due to the reactions he had last time.

## 2021-06-15 ENCOUNTER — Telehealth: Payer: Self-pay | Admitting: *Deleted

## 2021-06-15 MED ORDER — DOXAZOSIN MESYLATE 1 MG PO TABS: 1.0000 mg | ORAL_TABLET | Freq: Every day | ORAL | 3 refills | Status: DC

## 2021-06-15 MED ORDER — HYDROXYZINE HCL 25 MG PO TABS: 25.0000 mg | ORAL_TABLET | Freq: Three times a day (TID) | ORAL | 0 refills | Status: DC | PRN

## 2021-06-15 NOTE — Telephone Encounter (Signed)
Spoke with patient.  Last losartan was Friday.  Last lisinopril was ~10-14 days ago.  Has been taking amlodipine - but as per above. Amlodipine may have caused initial rash as well as tender burning itch to sides.   Rec hold all meds for the next week, then trial cardura in place of flomax. Cardura '1mg'$  sent to pharmacy

## 2021-06-15 NOTE — Telephone Encounter (Signed)
Patient called stating that he has had problems with blood pressure medications. Patient stated that he took Amlodipine 1/2 Sunday and Monday. Patient stated that he did not take one Tuesday or this morning. Patient stated that he woke up this morning around 2:00 am itching. Patient stated that he had some hydroxyzine on hand and took it which did help. Patient stated that he is at a point that he does not know what to do. Patient stated this morning his blood pressure was 147/94 and yesterday 136/92 heart rate in the range of 67-70. Patient denies a headache or any other symptoms. Patient stated that he wanted to let Dr. Danise Mina know that his mom is allergic to all kinds of preservatives primarily in foods. Patient was advised to continue to monitor his blood pressure. Patient was given ER precautions and he verbalized understanding.

## 2021-06-28 ENCOUNTER — Other Ambulatory Visit: Payer: Self-pay | Admitting: Family Medicine

## 2021-07-04 ENCOUNTER — Telehealth: Payer: Self-pay | Admitting: Family Medicine

## 2021-07-04 ENCOUNTER — Ambulatory Visit: Payer: 59 | Admitting: Family Medicine

## 2021-07-04 ENCOUNTER — Encounter: Payer: Self-pay | Admitting: Family Medicine

## 2021-07-04 ENCOUNTER — Other Ambulatory Visit: Payer: Self-pay

## 2021-07-04 VITALS — BP 132/92 | HR 68 | Temp 98.3°F | Ht 67.25 in | Wt 180.5 lb

## 2021-07-04 DIAGNOSIS — I10 Essential (primary) hypertension: Secondary | ICD-10-CM | POA: Diagnosis not present

## 2021-07-04 MED ORDER — DOXAZOSIN MESYLATE 4 MG PO TABS: 4.0000 mg | ORAL_TABLET | Freq: Two times a day (BID) | ORAL | 3 refills | Status: DC

## 2021-07-04 NOTE — Patient Instructions (Signed)
Send Mychart to Dr. Darnell Level with some BP readings in 1 month.

## 2021-07-04 NOTE — Progress Notes (Signed)
Graig Hessling T. Cid Agena, MD, [place] at [facility] [place], [address]  Phone: [phone]  FAX: [phone]  OSBOURNE HOLTZCLAW - 55 y.o. male  MRN [id number]  Date of Birth: 06-Jul-1966  Date: 07/04/2021  PCP: Ria Bush, MD  Referral: Ria Bush, MD  Chief Complaint  Patient presents with   Hypertension   This visit occurred during the SARS-CoV-2 public health emergency.  Safety protocols were in place, including screening questions prior to the visit, additional usage of staff PPE, and extensive cleaning of exam room while observing appropriate contact time as indicated for disinfecting solutions.   Subjective:   William Tucker is a 55 y.o. very pleasant male patient with Body mass index is 28.06 kg/m. who presents with the following:  HTN: Tolerating all medications without side effects -he had a lot of side effects with previous use muscles including amlodipine, lisinopril, as well as an angiotensin receptor blocker. He has increased his medication so he is taking doxazosin total of 5 mg a day spread out throughout the day. No CP, no sob.  He did have some headaches over the weekend.  BP Readings from Last 3 Encounters:  07/04/21 (!) 132/92  05/25/21 (!) 148/90  03/15/21 (!) 146/102   Norvasc Lisinopril Losartan ? Hives    Basic Metabolic Panel:    Component Value Date/Time   NA 138 06/08/2021 0754   K 3.9 06/08/2021 0754   CL 104 06/08/2021 0754   CO2 26 06/08/2021 0754   BUN 17 06/08/2021 0754   CREATININE 1.40 06/08/2021 0754   GLUCOSE 99 06/08/2021 0754   CALCIUM 8.9 06/08/2021 0754     Review of Systems is noted in the HPI, as appropriate  Objective:   BP (!) 132/92   Pulse 68   Temp 98.3 F (36.8 C) (Temporal)   Ht 5' 7.25" (1.708 m)   Wt 180 lb 8 oz (81.9 kg)   SpO2 96%   BMI 28.06 kg/m   GEN: No acute distress; alert,appropriate. PULM: Breathing comfortably  in no respiratory distress PSYCH: Normally interactive.  CV: RRR, no m/g/r   Laboratory and Imaging Data:  Assessment and Plan:     ICD-10-CM   1. Essential hypertension  I10      Acute on chronic hypertension with worsening and unstable.  He does seem to be able to tolerate Cardura okay.  He is tolerating a total of 4 mg a day, but his blood pressures remain elevated.  I think it is reasonable to increase this to 4 mg twice daily.  Thiazide diuretic might be reasonable to consider if this does not stabilize BP.  Meds ordered this encounter  Medications   doxazosin (CARDURA) 4 MG tablet    Sig: Take 1 tablet (4 mg total) by mouth 2 (two) times daily.    Dispense:  60 tablet    Refill:  3   Medications Discontinued During This Encounter  Medication Reason   doxazosin (CARDURA) 1 MG tablet    No orders of the defined types were placed in this encounter.   Follow-up: No follow-ups on file.  [employer] One speech-to-text software was used for transcription in this dictation.  Possible transcriptional errors can occur using Editor, commissioning.   Signed,  Maud Deed. Calvina Liptak, MD   Outpatient Encounter Medications as of 07/04/2021  Medication Sig   Cholecalciferol (VITAMIN D3 PO) Take by mouth daily.   cyanocobalamin 1000 MCG tablet Take  1,000 mcg by mouth daily.   doxazosin (CARDURA) 4 MG tablet Take 1 tablet (4 mg total) by mouth 2 (two) times daily.   finasteride (PROSCAR) 5 MG tablet TAKE 1 TABLET BY MOUTH  DAILY   hydrOXYzine (ATARAX/VISTARIL) 25 MG tablet Take 1 tablet (25 mg total) by mouth 3 (three) times daily as needed.   loratadine (CLARITIN) 10 MG tablet Take 10 mg by mouth daily.   Multiple Vitamins-Minerals (ZINC PO) Take by mouth daily.   tadalafil 2.5 MG TABS Take 1 tablet (2.5 mg total) by mouth daily.   tamsulosin (FLOMAX) 0.4 MG CAPS capsule TAKE 1 CAPSULE BY MOUTH  DAILY   triamcinolone cream (KENALOG) 0.1 % Apply 1 application topically 2 (two) times  daily. Apply to AA.   [DISCONTINUED] doxazosin (CARDURA) 1 MG tablet Take 1 tablet (1 mg total) by mouth daily.   No facility-administered encounter medications on file as of 07/04/2021.

## 2021-07-04 NOTE — Telephone Encounter (Signed)
PLEASE NOTE: All timestamps contained within this report are represented as [place] Standard Time. CONFIDENTIALTY NOTICE: This fax transmission is intended only for the addressee. It contains information that is legally privileged, confidential or otherwise protected from use or disclosure. If you are not the intended recipient, you are strictly prohibited from reviewing, disclosing, copying using or disseminating any of this information or taking any action in reliance on or regarding this information. If you have received this fax in error, please notify us immediately by telephone so that we can arrange for its return to [place]. Phone: [phone], Toll-Free: [phone], Fax: [phone] Page: 1 of 2 Call Id: [id number] [facility] - Client TELEPHONE ADVICE RECORD [facility] Patient Name: William Tucker Gender: Male DOB: [birth year] Age: 55 Y 59 M 6 D Return Phone Number: [phone] (Primary) Address: City/ State/ Zip: [place] [address] Client [facility] - Client Client Site [place] - Day Physician Ria Bush - MD Contact Type Call Who Is Calling Patient / Member / Family / Caregiver Call Type Triage / Clinical Relationship To Patient Self Return Phone Number [phone] (Primary) Chief Complaint Blood Pressure High Reason for Call Symptomatic / Request for Health Information Initial Comment Caller states BP has been high past few days; Running 95-97 bottom number and 104-405 bottom on other days; on Cardura 1 mg; took '4mg'$  1 at at a time; 165/107 yesterday; 158/89 now but was higher earlier; Translation No Nurse Assessment Nurse: Jearld Pies, RN, Lovena Le Date/Time (Eastern Time): 07/04/2021 9:38:40 AM Confirm and document reason for call. If symptomatic, describe symptoms. ---BP 159/101 at this time. Started Cardura 1 mg once daily 06/22/2021. Was on other medications that I could not  tolerate so the doctor changed it. Took several tablets yesterday. Has taken '2mg'$  10 min ago. Denies dizziness, chest pain, SOB, headache, or any other symptoms at this time. Does the patient have any new or worsening symptoms? ---Yes Will a triage be completed? ---Yes Related visit to physician within the last 2 weeks? ---Yes Does the PT have any chronic conditions? (i.e. diabetes, asthma, this includes High risk factors for pregnancy, etc.) ---Yes List chronic conditions. ---HTN Is this a behavioral health or substance abuse call? ---No Guidelines Guideline Title Affirmed Question Affirmed Notes Nurse Date/Time (Eastern Time) Blood Pressure - High Systolic BP >= 0000000 OR Diastolic >= 123XX123 Jake Bathe 07/04/2021 9:42:06 AM Disp. Time Eilene Ghazi Time) Disposition Final User PLEASE NOTE: All timestamps contained within this report are represented as [place] Standard Time. CONFIDENTIALTY NOTICE: This fax transmission is intended only for the addressee. It contains information that is legally privileged, confidential or otherwise protected from use or disclosure. If you are not the intended recipient, you are strictly prohibited from reviewing, disclosing, copying using or disseminating any of this information or taking any action in reliance on or regarding this information. If you have received this fax in error, please notify us immediately by telephone so that we can arrange for its return to [place]. Phone: [phone], Toll-Free: [phone], Fax: [phone] Page: 2 of 2 Call Id: [id number] 07/04/2021 9:45:09 AM SEE PCP WITHIN 3 DAYS Yes Jearld Pies, RN, Apolonio Schneiders Disagree/Comply Comply Caller Understands Yes PreDisposition Call Doctor Care Advice Given Per Guideline SEE PCP WITHIN 3 DAYS: * You become worse * Your blood pressure is over 180/110 * Chest pain or difficulty breathing occurs * Difficulty walking, difficulty talking, or severe headache occurs * Weakness or numbness of  the face,  arm or leg on one side of the body occurs CALL BACK IF: Referrals REFERRED TO PCP OFFICE

## 2021-07-04 NOTE — Telephone Encounter (Signed)
Patient scheduled to see Dr. Lorelei Pont today 07/04/21 at 10:40 am.

## 2021-07-04 NOTE — Telephone Encounter (Signed)
Noted. Thanks to Dr Lorelei Pont for seeing patient.

## 2021-07-04 NOTE — Telephone Encounter (Signed)
Patient call in with concern about his BP was Transfer to Triage  nurse

## 2021-11-01 ENCOUNTER — Other Ambulatory Visit: Payer: Self-pay | Admitting: Family Medicine

## 2021-11-02 NOTE — Telephone Encounter (Signed)
Mychart message sent to pt asking for BP readings.

## 2021-11-04 NOTE — Telephone Encounter (Signed)
Doxazosin Last filled:  10/04/27, #60 Last OV:  07/04/21, HTN Next OV:  03/17/22, CPE

## 2021-11-04 NOTE — Telephone Encounter (Addendum)
Spoke with pt relaying Dr. Synthia Innocent message.  Pt confirms he is taking Cardura in place of Flomax.  Fyi to Dr. Darnell Level.

## 2021-11-04 NOTE — Telephone Encounter (Signed)
I've refilled cardura but need to verify he's taking this in place of not together with flomax.

## 2021-11-05 NOTE — Addendum Note (Signed)
Addended by: Ria Bush on: 11/05/2021 10:12 AM   Modules accepted: Orders

## 2021-11-18 MED ORDER — METOPROLOL SUCCINATE ER 25 MG PO TB24: 12.5000 mg | ORAL_TABLET | Freq: Every day | ORAL | 6 refills | Status: DC

## 2021-11-18 MED ORDER — DOXAZOSIN MESYLATE 4 MG PO TABS: 4.0000 mg | ORAL_TABLET | Freq: Two times a day (BID) | ORAL | 3 refills | Status: DC

## 2021-12-19 ENCOUNTER — Ambulatory Visit: Payer: 59 | Admitting: Family Medicine

## 2021-12-19 ENCOUNTER — Telehealth: Payer: Self-pay

## 2021-12-19 ENCOUNTER — Other Ambulatory Visit: Payer: Self-pay

## 2021-12-19 VITALS — BP 144/90 | HR 62 | Temp 97.8°F | Wt 179.6 lb

## 2021-12-19 DIAGNOSIS — I10 Essential (primary) hypertension: Secondary | ICD-10-CM | POA: Diagnosis not present

## 2021-12-19 DIAGNOSIS — E0500 Thyrotoxicosis with diffuse goiter without thyrotoxic crisis or storm: Secondary | ICD-10-CM

## 2021-12-19 MED ORDER — HYDROCHLOROTHIAZIDE 25 MG PO TABS: 25.0000 mg | ORAL_TABLET | Freq: Every day | ORAL | 0 refills | Status: DC

## 2021-12-19 NOTE — Assessment & Plan Note (Signed)
Poorly controlled. HA is improved. Discussed starting HCTZ. Return for labs in 1-2 weeks. Cont cardura 4 mg bid and metoprolol 12.5 mg (HR 60s). Return for annual in May or sooner if BP not improving

## 2021-12-19 NOTE — Telephone Encounter (Signed)
PLEASE NOTE: All timestamps contained within this report are represented as [place] Standard Time. CONFIDENTIALTY NOTICE: This fax transmission is intended only for the addressee. It contains information that is legally privileged, confidential or otherwise protected from use or disclosure. If you are not the intended recipient, you are strictly prohibited from reviewing, disclosing, copying using or disseminating any of this information or taking any action in reliance on or regarding this information. If you have received this fax in error, please notify us immediately by telephone so that we can arrange for its return to [place]. Phone: [phone], Toll-Free: [phone], Fax: [phone] Page: 1 of 2 Call Id: [id number] William Tucker - Client TELEPHONE ADVICE RECORD [facility] Patient Name: William Tucker Gender: Male DOB: 1966/07/24 Age: 56 Y 11 M 21 D Return Phone Number: [phone] (Primary) Address: City/ State/ Zip: [place]  [address] Client [place] Tucker - Client Client Site [place] Provider Ria Bush - MD Contact Type Call Who Is Calling Patient / Member / Family / Caregiver Call Type Triage / Clinical Relationship To Patient Self Return Phone Number [phone] (Primary) Chief Complaint Headache Reason for Call Symptomatic / Request for Cecil states his blood pressure is high, he has a headache and is feeling weird. Still has a dull headache and blood pressure this mornig 150/95 pulse was 68. BP was 176/121 the other Tucker. He went home and took again, home machine was 170/112, he took his medicine and it went down 150/97. States he might have been having chest discomfort but denies it at this time, stating he thinks it was in his head. Translation No Nurse Assessment Nurse: Alvis Lemmings, RN, Marcie Bal Date/Time Eilene Ghazi Time): 12/19/2021  10:05:57 AM Confirm and document reason for call. If symptomatic, describe symptoms. ---Caller states his blood pressure is high, he has a headache and is feeling weird. Still has a dull headache and blood pressure this morning 150/95 pulse was 68. BP was 176/121 the other Tucker. He went home and took again, home machine was 170/112, he took his medicine and it went down 150/97. States he might have been having chest discomfort, but denies it at this time, stating he thinks it was in his head. Is on meds. Does the patient have any new or worsening symptoms? ---Yes Will a triage be completed? ---Yes Related visit to physician within the last 2 weeks? ---No Does the PT have any chronic conditions? (i.e. diabetes, asthma, this includes High risk factors for pregnancy, etc.) ---Yes List chronic conditions. ---HTN on Cardura, prostate, Proscar, Is this a behavioral health or substance abuse call? ---No PLEASE NOTE: All timestamps contained within this report are represented as [place] Standard Time. CONFIDENTIALTY NOTICE: This fax transmission is intended only for the addressee. It contains information that is legally privileged, confidential or otherwise protected from use or disclosure. If you are not the intended recipient, you are strictly prohibited from reviewing, disclosing, copying using or disseminating any of this information or taking any action in reliance on or regarding this information. If you have received this fax in error, please notify us immediately by telephone so that we can arrange for its return to [place]. Phone: [phone], Toll-Free: [phone], Fax: [phone] Page: 2 of 2 Call Id: [id number] Guidelines Guideline Title Affirmed Question Affirmed Notes Nurse Date/Time Eilene Ghazi Time) Blood Pressure - High [7] Systolic BP >= 341 OR Diastolic >= 80 AND [9] taking BP medications Alvis Lemmings, Therapist, sports,  Marcie Bal 12/19/2021 10:09:13 AM Disp. Time Eilene Ghazi Time) Disposition  Final User 12/19/2021 9:35:41 AM Attempt made - message left Etter Sjogren 12/19/2021 10:14:54 AM Call PCP within 24 Hours Yes Alvis Lemmings, RN, Marcie Bal Disposition Overriden: See PCP within 2 Weeks Override Reason: Patients symptoms need a higher level of care Caller Disagree/Comply Comply Caller Understands Yes PreDisposition Call Doctor Care Advice Given Per Guideline CALL PCP WITHIN 24 HOURS: * You need to discuss this with your doctor (or NP/PA) within the next 24 hours. HIGH BLOOD PRESSURE MEDICINES: * It is important to take prescribed medications as directed. * Untreated hypertension may cause damage to the heart, brain, kidneys, and eyes. * The goal of blood pressure treatment for most people with hypertension is to keep the blood pressure under 140/90. For people that are 60 years or older, your doctor may instead want to keep the blood pressure under 150/90. CARE ADVICE given per High Blood Pressure (Adult) guideline. CALL BACK IF: * Weakness or numbness of the face, arm or leg on one side of the body occurs * Chest pain or difficulty breathing occurs * Difficulty walking, difficulty talking, or severe headache occurs * You become worse

## 2021-12-19 NOTE — Assessment & Plan Note (Signed)
TSH has been normal but given elevated BP will recheck with next lab draw.

## 2021-12-19 NOTE — Patient Instructions (Addendum)
#  Blood pressure - continue current medications - Start HCTZ (hydrochlorothiazide) 25 mg - if lightheadedness - decrease to 1/2 tablet  Update via mychart blood pressure readings Return for labs within 4 weeks   Tylenol for headache as needed  If not response to pain medication and high blood pressure -- call or go to urgent care/ER

## 2021-12-19 NOTE — Progress Notes (Signed)
Subjective:     William Tucker is a 56 y.o. male presenting for Hypertension (176/121 on Saturday. 150/97, P: 68 this morning. ) and Headache (Has gotten better since Saturday )     Hypertension Associated symptoms include headaches.  Headache  His past medical history is significant for hypertension.   #HTN - this weekend noticed elevated bp - had a HA associated with this - did not take medication for the HA - thought it was related to increased activity and not eating - mild dull feeling in the back of the head - no cp, breathing difficulty, no palpitations, vision changes  Hx of allergies 2/2 to medications - failed amlodipine and lisinopril 2/2 to rash  11/02/2021 - mychart - start metoprolol at that time  Review of Systems  Neurological:  Positive for headaches.    Social History   Tobacco Use  Smoking Status Never  Smokeless Tobacco Former   Types: Snuff, Chew  Tobacco Comments   Occasional dip snuff 20 years ago        Objective:    BP Readings from Last 3 Encounters:  12/19/21 (!) 144/90  07/04/21 (!) 132/92  05/25/21 (!) 148/90   Wt Readings from Last 3 Encounters:  12/19/21 179 lb 9 oz (81.4 kg)  07/04/21 180 lb 8 oz (81.9 kg)  05/25/21 181 lb 5 oz (82.2 kg)    BP (!) 144/90    Pulse 62    Temp 97.8 F (36.6 C) (Oral)    Wt 179 lb 9 oz (81.4 kg)    SpO2 97%    BMI 27.91 kg/m    Physical Exam Constitutional:      Appearance: Normal appearance. He is not ill-appearing or diaphoretic.  HENT:     Head: Normocephalic and atraumatic.     Right Ear: External ear normal.     Left Ear: External ear normal.     Nose: Nose normal.  Eyes:     General: No scleral icterus.    Extraocular Movements: Extraocular movements intact.     Right eye: No nystagmus.     Left eye: No nystagmus.     Conjunctiva/sclera: Conjunctivae normal.     Pupils: Pupils are equal, round, and reactive to light.  Cardiovascular:     Rate and Rhythm: Normal rate and  regular rhythm.     Heart sounds: No murmur heard. Pulmonary:     Effort: Pulmonary effort is normal. No respiratory distress.     Breath sounds: Normal breath sounds. No wheezing.  Musculoskeletal:        General: Normal range of motion.     Cervical back: Neck supple.  Skin:    General: Skin is warm and dry.  Neurological:     Mental Status: He is alert. Mental status is at baseline.  Psychiatric:        Mood and Affect: Mood normal.          Assessment & Plan:   Problem List Items Addressed This Visit       Cardiovascular and Mediastinum   Hypertension    Poorly controlled. HA is improved. Discussed starting HCTZ. Return for labs in 1-2 weeks. Cont cardura 4 mg bid and metoprolol 12.5 mg (HR 60s). Return for annual in May or sooner if BP not improving      Relevant Medications   hydrochlorothiazide (HYDRODIURIL) 25 MG tablet   Other Relevant Orders   Basic metabolic panel     Endocrine  Graves disease - Primary (Chronic)    TSH has been normal but given elevated BP will recheck with next lab draw.       Relevant Orders   TSH     Return in about 4 weeks (around 01/16/2022) for with Dr. Fredia Beets, MD  This visit occurred during the SARS-CoV-2 public health emergency.  Safety protocols were in place, including screening questions prior to the visit, additional usage of staff PPE, and extensive cleaning of exam room while observing appropriate contact time as indicated for disinfecting solutions.

## 2021-12-19 NOTE — Telephone Encounter (Signed)
I spoke with pt but he said he has already spoken with a nurse and someone from [facility]. Pt plans on keeping appt today with Dr Einar Pheasant. Regina LPN then teams note she was  handling this call.

## 2021-12-19 NOTE — Telephone Encounter (Signed)
Spoke to patient by telephone and was advised that he got the real high blood pressure readings Saturday. Patient stated his blood pressure this morning when he checked it was 150/97 heart rate 68.Patient stated that he does have a dull headache but feels better today than he did Saturday. Patient stated that he does not feel that he needs to go to the ER and has an appointment already at the office at shortly at 11:20 am. Patient was given ER precautions and he verbalized understanding.

## 2022-01-10 ENCOUNTER — Other Ambulatory Visit: Payer: Self-pay | Admitting: Family Medicine

## 2022-01-10 DIAGNOSIS — I10 Essential (primary) hypertension: Secondary | ICD-10-CM

## 2022-01-16 ENCOUNTER — Ambulatory Visit: Payer: 59 | Admitting: Family Medicine

## 2022-01-16 ENCOUNTER — Other Ambulatory Visit: Payer: Self-pay

## 2022-01-16 ENCOUNTER — Encounter: Payer: Self-pay | Admitting: Family Medicine

## 2022-01-16 VITALS — BP 134/90 | HR 86 | Temp 97.5°F | Ht 67.25 in | Wt 178.1 lb

## 2022-01-16 DIAGNOSIS — I10 Essential (primary) hypertension: Secondary | ICD-10-CM

## 2022-01-16 DIAGNOSIS — E0500 Thyrotoxicosis with diffuse goiter without thyrotoxic crisis or storm: Secondary | ICD-10-CM | POA: Diagnosis not present

## 2022-01-16 NOTE — Patient Instructions (Addendum)
Good to see you today  ?Labs today  ?Blood pressures are staying a bit elevated - but did improve on retesting.   ?Continue monitoring at home.  ?Increase water intake. Continue limiting salt.  ?

## 2022-01-16 NOTE — Assessment & Plan Note (Signed)
Update TSH

## 2022-01-16 NOTE — Progress Notes (Signed)
? ? Patient ID: William Tucker, male    DOB: [birth year], 56 y.o.   MRN: [id number] ? ?This visit was conducted in person. ? ?BP 134/90 (BP Location: Right Arm, Cuff Size: Normal)   Pulse 86   Temp (!) 97.5 ?F (36.4 ?C) (Temporal)   Ht 5' 7.25" (1.708 m)   Wt 178 lb 2 oz (80.8 kg)   SpO2 96%   BMI 27.69 kg/m?   ? ?CC: HTN f/u visit  ?Subjective:  ? ?HPI: ?William Tucker is a 56 y.o. male presenting on 01/16/2022 for Hypertension (Here for 4 wk f/u, per Dr. Einar Pheasant. BP this AM when waking,124/85.) ? ? ?HTN - Compliant with current antihypertensive regimen of cardura '4mg'$  bid, hctz '25mg'$  daily, toprol XL '25mg'$  daily. Does check blood pressures at home and brings log - see below.  No low blood pressure readings or symptoms of dizziness/syncope.  Denies HA, vision changes, CP/tightness, SOB, leg swelling.  Occasional L parietal headache.  ? ?Recent home BP readings: ?2/28. 137/89 90 ?3/2 121/82  82 ?3/4  134/80  67 ?3/5  134/ 88  65 ?3/12 145/97 HR 68 ?3/14 143/94, HR 69 ?3/15 147/93, HR 68 ?3/18 141/95 HR 64 ?3/21 129/82 HR 70 ?3/24 142/92, HR 62 ?3/27 124/85, HR 85 ? ?H/o lisinopril and amlodipine intolerance due to rash.  ? ?Graves disease - denies other hyper or hypothyroid symptoms. No skin or hair changes. No diarrhea/constipation, unexpected weight changes.  ?   ? ?Relevant past medical, surgical, family and social history reviewed and updated as indicated. Interim medical history since our last visit reviewed. ?Allergies and medications reviewed and updated. ?Allergies  ?Allergen Reactions  ? Lisinopril Itching and Other (See Comments)  ?  Food stuck in throat  ? Losartan Itching  ? Amlodipine Rash  ?  Rash to skin as well as burning itch  ? ?Outpatient Medications Prior to Visit  ?Medication Sig Dispense Refill  ? Cholecalciferol (VITAMIN D3 PO) Take by mouth daily.    ? cyanocobalamin 1000 MCG tablet Take 1,000 mcg by mouth daily.    ? doxazosin (CARDURA) 4 MG tablet Take 1 tablet (4 mg total) by mouth 2  (two) times daily. 180 tablet 3  ? finasteride (PROSCAR) 5 MG tablet TAKE 1 TABLET BY MOUTH  DAILY 90 tablet 3  ? hydrochlorothiazide (HYDRODIURIL) 25 MG tablet TAKE 1 TABLET (25 MG TOTAL) BY MOUTH DAILY. 30 tablet 0  ? hydrOXYzine (ATARAX/VISTARIL) 25 MG tablet Take 1 tablet (25 mg total) by mouth 3 (three) times daily as needed. 30 tablet 0  ? loratadine (CLARITIN) 10 MG tablet Take 10 mg by mouth daily.    ? metoprolol succinate (TOPROL-XL) 25 MG 24 hr tablet Take 0.5 tablets (12.5 mg total) by mouth daily. 15 tablet 6  ? Multiple Vitamins-Minerals (ZINC PO) Take by mouth daily.    ? tadalafil 2.5 MG TABS Take 1 tablet (2.5 mg total) by mouth daily. 90 tablet 1  ? triamcinolone cream (KENALOG) 0.1 % Apply 1 application topically 2 (two) times daily. Apply to AA. 80 g 0  ? ?No facility-administered medications prior to visit.  ?  ? ?Per HPI unless specifically indicated in ROS section below ?Review of Systems ? ?Objective:  ?BP 134/90 (BP Location: Right Arm, Cuff Size: Normal)   Pulse 86   Temp (!) 97.5 ?F (36.4 ?C) (Temporal)   Ht 5' 7.25" (1.708 m)   Wt 178 lb 2 oz (80.8 kg)   SpO2 96%  BMI 27.69 kg/m?   ?Wt Readings from Last 3 Encounters:  ?01/16/22 178 lb 2 oz (80.8 kg)  ?12/19/21 179 lb 9 oz (81.4 kg)  ?07/04/21 180 lb 8 oz (81.9 kg)  ?  ?  ?Physical Exam ?Vitals and nursing note reviewed.  ?Constitutional:   ?   Appearance: Normal appearance. He is not ill-appearing.  ?Neck:  ?   Thyroid: No thyroid mass or thyromegaly.  ?Cardiovascular:  ?   Rate and Rhythm: Normal rate and regular rhythm.  ?   Pulses: Normal pulses.  ?   Heart sounds: Normal heart sounds. No murmur heard. ?Pulmonary:  ?   Effort: Pulmonary effort is normal. No respiratory distress.  ?   Breath sounds: Normal breath sounds. No wheezing, rhonchi or rales.  ?Musculoskeletal:  ?   Right lower leg: No edema.  ?   Left lower leg: No edema.  ?Skin: ?   General: Skin is warm and dry.  ?   Findings: No rash.  ?Neurological:  ?   Mental  Status: He is alert.  ?Psychiatric:     ?   Mood and Affect: Mood normal.     ?   Behavior: Behavior normal.  ? ?   ?Results for orders placed or performed in visit on 06/08/21  ?Renal function panel  ?Result Value Ref Range  ? Sodium 138 135 - 145 mEq/L  ? Potassium 3.9 3.5 - 5.1 mEq/L  ? Chloride 104 96 - 112 mEq/L  ? CO2 26 19 - 32 mEq/L  ? Albumin 4.3 3.5 - 5.2 g/dL  ? BUN 17 6 - 23 mg/dL  ? Creatinine, Ser 1.40 0.40 - 1.50 mg/dL  ? Glucose, Bld 99 70 - 99 mg/dL  ? Phosphorus 2.4 2.3 - 4.6 mg/dL  ? GFR 57.41 (L) >60.00 mL/min  ? Calcium 8.9 8.4 - 10.5 mg/dL  ? ? ?Assessment & Plan:  ?This visit occurred during the SARS-CoV-2 public health emergency.  Safety protocols were in place, including screening questions prior to the visit, additional usage of staff PPE, and extensive cleaning of exam room while observing appropriate contact time as indicated for disinfecting solutions.  ? ?Problem List Items Addressed This Visit   ? ? Graves disease  ?  Update TSH.  ?  ?  ? Relevant Orders  ? TSH  ? Hypertension - Primary  ?  Chronic, runs borderline high despite 3 drug regimen (BB, HCTZ, cardura). Check for secondary causes of HTN with aldosterone and TSH. Pending results, consider trial of ARB (losartan previously caused itching) vs spironolactone. Also discussed increased water and limiting salt/sodium in diet.  ?Pt agrees with plan.  ?  ?  ? Relevant Orders  ? CBC with Differential/Platelet  ? Renal function panel  ? Microalbumin / creatinine urine ratio  ? Aldosterone + renin activity w/ ratio  ?  ? ?No orders of the defined types were placed in this encounter. ? ?Orders Placed This Encounter  ?Procedures  ? TSH  ? CBC with Differential/Platelet  ? Renal function panel  ? Microalbumin / creatinine urine ratio  ? Aldosterone + renin activity w/ ratio  ? ? ? ?Patient Instructions  ?Good to see you today  ?Labs today  ?Blood pressures are staying a bit elevated - but did improve on retesting.   ?Continue monitoring at  home.  ?Increase water intake. Continue limiting salt.  ? ?Follow up plan: ?Return if symptoms worsen or fail to improve. ? ?Ria Bush, MD   ?

## 2022-01-16 NOTE — Assessment & Plan Note (Signed)
Chronic, runs borderline high despite 3 drug regimen (BB, HCTZ, cardura). Check for secondary causes of HTN with aldosterone and TSH. Pending results, consider trial of ARB (losartan previously caused itching) vs spironolactone. Also discussed increased water and limiting salt/sodium in diet.  ?Pt agrees with plan.  ?

## 2022-01-17 LAB — CBC WITH DIFFERENTIAL/PLATELET
Basophils Absolute: 0 10*3/uL (ref 0.0–0.1)
Basophils Relative: 0.9 % (ref 0.0–3.0)
Eosinophils Absolute: 0.2 10*3/uL (ref 0.0–0.7)
Eosinophils Relative: 5.9 % — ABNORMAL HIGH (ref 0.0–5.0)
HCT: 42.8 % (ref 39.0–52.0)
Hemoglobin: 15 g/dL (ref 13.0–17.0)
Lymphocytes Relative: 25.3 % (ref 12.0–46.0)
Lymphs Abs: 1 10*3/uL (ref 0.7–4.0)
MCHC: 35 g/dL (ref 30.0–36.0)
MCV: 91.8 fl (ref 78.0–100.0)
Monocytes Absolute: 0.4 10*3/uL (ref 0.1–1.0)
Monocytes Relative: 10.3 % (ref 3.0–12.0)
Neutro Abs: 2.3 10*3/uL (ref 1.4–7.7)
Neutrophils Relative %: 57.6 % (ref 43.0–77.0)
Platelets: 190 10*3/uL (ref 150.0–400.0)
RBC: 4.66 Mil/uL (ref 4.22–5.81)
RDW: 13.1 % (ref 11.5–15.5)
WBC: 4 10*3/uL (ref 4.0–10.5)

## 2022-01-17 LAB — MICROALBUMIN / CREATININE URINE RATIO
Creatinine,U: 134 mg/dL
Microalb Creat Ratio: 0.6 mg/g (ref 0.0–30.0)
Microalb, Ur: 0.8 mg/dL (ref 0.0–1.9)

## 2022-01-17 LAB — RENAL FUNCTION PANEL
Albumin: 4.8 g/dL (ref 3.5–5.2)
BUN: 17 mg/dL (ref 6–23)
CO2: 30 mEq/L (ref 19–32)
Calcium: 9.5 mg/dL (ref 8.4–10.5)
Chloride: 101 mEq/L (ref 96–112)
Creatinine, Ser: 1.59 mg/dL — ABNORMAL HIGH (ref 0.40–1.50)
GFR: 49.07 mL/min — ABNORMAL LOW (ref >60.00)
Glucose, Bld: 93 mg/dL (ref 70–99)
Phosphorus: 2.8 mg/dL (ref 2.3–4.6)
Potassium: 3.7 mEq/L (ref 3.5–5.1)
Sodium: 138 mEq/L (ref 135–145)

## 2022-01-17 LAB — TSH: TSH: 0.79 u[IU]/mL (ref 0.35–5.50)

## 2022-01-18 ENCOUNTER — Other Ambulatory Visit: Payer: 59

## 2022-01-18 ENCOUNTER — Other Ambulatory Visit: Payer: Self-pay | Admitting: Family Medicine

## 2022-01-18 DIAGNOSIS — I10 Essential (primary) hypertension: Secondary | ICD-10-CM

## 2022-01-19 ENCOUNTER — Other Ambulatory Visit: Payer: 59

## 2022-01-19 ENCOUNTER — Telehealth: Payer: Self-pay

## 2022-01-19 ENCOUNTER — Other Ambulatory Visit (INDEPENDENT_AMBULATORY_CARE_PROVIDER_SITE_OTHER): Payer: 59

## 2022-01-19 DIAGNOSIS — I10 Essential (primary) hypertension: Secondary | ICD-10-CM

## 2022-01-19 DIAGNOSIS — M79676 Pain in unspecified toe(s): Secondary | ICD-10-CM

## 2022-01-19 DIAGNOSIS — I159 Secondary hypertension, unspecified: Secondary | ICD-10-CM

## 2022-01-19 DIAGNOSIS — M79673 Pain in unspecified foot: Secondary | ICD-10-CM | POA: Diagnosis not present

## 2022-01-19 LAB — URIC ACID: Uric Acid, Serum: 8.1 mg/dL — ABNORMAL HIGH (ref 4.0–7.8)

## 2022-01-19 NOTE — Telephone Encounter (Signed)
He doesn't have known h/o gout however hctz can cause gout to manifest.  ?Will see if we can add uric acid to blood drawn today.  ?Ok to stay off hctz. Would he be willing to retrial losartan? Previously caused itching.  ?

## 2022-01-19 NOTE — Telephone Encounter (Signed)
Spoke with pt relaying Dr. Synthia Innocent message.  Verbalizes understanding and agrees to retry losartan.  Thinks lisinopril bothered him more than losartan did. ?

## 2022-01-19 NOTE — Telephone Encounter (Signed)
Attempted to contact pt.  Vm box full.  Need to relay Dr. Synthia Innocent message and get answer to his question. ?

## 2022-01-19 NOTE — Telephone Encounter (Signed)
Pt came in for labs this morning and said he started having issues with toe pain. Believes it was HCTZ that was recently added. He stopped the medication in fear it was gout. Please advise. ?

## 2022-01-20 DIAGNOSIS — M79676 Pain in unspecified toe(s): Secondary | ICD-10-CM | POA: Insufficient documentation

## 2022-01-20 MED ORDER — LOSARTAN POTASSIUM 50 MG PO TABS: 50.0000 mg | ORAL_TABLET | Freq: Every day | ORAL | 6 refills | Status: DC

## 2022-01-20 NOTE — Addendum Note (Signed)
Addended by: Ria Bush on: 01/20/2022 07:14 AM ? ? Modules accepted: Orders ? ?

## 2022-01-20 NOTE — Telephone Encounter (Signed)
Urate elevated. ?Stop hctz start losartan '50mg'$  daily.  ?

## 2022-01-27 LAB — ALDOSTERONE + RENIN ACTIVITY W/ RATIO
ALDO / PRA Ratio: 16.7 Ratio (ref 0.9–28.9)
Aldosterone: 10 ng/dL
Renin Activity: 0.6 ng/mL/h (ref 0.25–5.82)

## 2022-02-03 ENCOUNTER — Other Ambulatory Visit: Payer: Self-pay | Admitting: Family Medicine

## 2022-02-03 DIAGNOSIS — I10 Essential (primary) hypertension: Secondary | ICD-10-CM

## 2022-02-03 NOTE — Telephone Encounter (Signed)
Pt told to hold this med for now. (see 01/19/22 phn note) ?

## 2022-02-28 MED ORDER — AMLODIPINE BESYLATE 2.5 MG PO TABS: 2.5000 mg | ORAL_TABLET | Freq: Every day | ORAL | 3 refills | Status: DC

## 2022-02-28 NOTE — Telephone Encounter (Signed)
Spoke to patient by telephone and was advised that he has had a little itching occasionally in the bend of his arm and neck. Patient stated that he has not had any hives or rash. Patient stated that the cream he uses takes care of the itching. Patient stated that he is willing to try the low dose of the Amlodipine.Patient stated that he has a few of the higher dose of the Amlodipine and will cut those in half to use them up. Patient does want Dr. Danise Mina to send in a new script for the lower dose to [facility]. Patient wanted to know if he is to continue the doxazosin and metoprolol along with Losartan and Amlodipine.  ?Pharmacy [facility] ?

## 2022-02-28 NOTE — Telephone Encounter (Signed)
Pt called back about the medication and said he felt like it hasnt really been helping lower his BP to much and he gave some numbers. 139/94 back in early April and he said May 7th he checked it 151/101 and 67 pulse and he took his medicine and an hour later it was 144/95, Yesterday was 136/93 and pulse of 71 in the morning. Today it was 141/94 this morning and a few hours later it was 130/89. He said he feels like when he was on the amlodipine it did the best even though he had a little bit of a reaction. He wasn't sure if you would want to change before his appt later this month. Please callback at [phone].  ?

## 2022-02-28 NOTE — Telephone Encounter (Addendum)
Actually, let's stop losartan. ?Continue cardura '4mg'$  bid, metoprolol XL 12.'5mg'$  daily, add amlodipine 2.'5mg'$  daily.  ?I'd also like to check renal artery ultrasound for other cause of high blood pressure.  ?We will call him for appt.  ?

## 2022-02-28 NOTE — Telephone Encounter (Signed)
Left message on voicemail for patient to call the office back. 

## 2022-02-28 NOTE — Telephone Encounter (Addendum)
Has he had any itching on losartan '50mg'$  daily?  ?Amlodipine previously may have caused a rash to skin along with a burning itch.  ?Would suggest continuing losartan '50mg'$  daily and adding low dose amlodipine 2.'5mg'$  daily, seeing if he can tolerate both at low doses.  ?If he agrees, I can send in amlodipine 2.'5mg'$  daily to take with his losartan '50mg'$  daily.  ?

## 2022-02-28 NOTE — Addendum Note (Signed)
Addended by: Ria Bush on: 02/28/2022 05:31 PM ? ? Modules accepted: Orders ? ?

## 2022-03-01 NOTE — Telephone Encounter (Signed)
Patient notified as instructed by telephone and verbalized understanding. Patient stated that he would like to proceed with the ultrasound. Patient stated that he had Amlodipine 5 mg on hand and cut one in half and took it last night.  Patient stated his blood pressure this morning was 129/95 and heart rate 70. Patient stated that he is going to start taking the Amlodipine the mornings. Patient was advised that he will hear back from the referral coordinator to get the Ultrasound scheduled. ?

## 2022-03-10 ENCOUNTER — Other Ambulatory Visit: Payer: Self-pay | Admitting: Family Medicine

## 2022-03-10 DIAGNOSIS — E538 Deficiency of other specified B group vitamins: Secondary | ICD-10-CM

## 2022-03-10 DIAGNOSIS — E785 Hyperlipidemia, unspecified: Secondary | ICD-10-CM

## 2022-03-10 DIAGNOSIS — E0500 Thyrotoxicosis with diffuse goiter without thyrotoxic crisis or storm: Secondary | ICD-10-CM

## 2022-03-10 DIAGNOSIS — N138 Other obstructive and reflux uropathy: Secondary | ICD-10-CM

## 2022-03-13 ENCOUNTER — Other Ambulatory Visit (INDEPENDENT_AMBULATORY_CARE_PROVIDER_SITE_OTHER): Payer: 59

## 2022-03-13 DIAGNOSIS — N401 Enlarged prostate with lower urinary tract symptoms: Secondary | ICD-10-CM

## 2022-03-13 DIAGNOSIS — E0500 Thyrotoxicosis with diffuse goiter without thyrotoxic crisis or storm: Secondary | ICD-10-CM | POA: Diagnosis not present

## 2022-03-13 DIAGNOSIS — E785 Hyperlipidemia, unspecified: Secondary | ICD-10-CM

## 2022-03-13 DIAGNOSIS — N138 Other obstructive and reflux uropathy: Secondary | ICD-10-CM | POA: Diagnosis not present

## 2022-03-13 DIAGNOSIS — E538 Deficiency of other specified B group vitamins: Secondary | ICD-10-CM

## 2022-03-13 LAB — LIPID PANEL
Cholesterol: 164 mg/dL (ref 0–200)
HDL: 59.6 mg/dL (ref >39.00)
LDL Cholesterol: 92 mg/dL (ref 0–99)
NonHDL: 104.03
Total CHOL/HDL Ratio: 3
Triglycerides: 59 mg/dL (ref 0.0–149.0)
VLDL: 11.8 mg/dL (ref 0.0–40.0)

## 2022-03-13 LAB — COMPREHENSIVE METABOLIC PANEL
ALT: 12 U/L (ref 0–53)
AST: 15 U/L (ref 0–37)
Albumin: 4.6 g/dL (ref 3.5–5.2)
Alkaline Phosphatase: 44 U/L (ref 39–117)
BUN: 16 mg/dL (ref 6–23)
CO2: 28 mEq/L (ref 19–32)
Calcium: 9 mg/dL (ref 8.4–10.5)
Chloride: 104 mEq/L (ref 96–112)
Creatinine, Ser: 1.44 mg/dL (ref 0.40–1.50)
GFR: 55.2 mL/min — ABNORMAL LOW (ref >60.00)
Glucose, Bld: 107 mg/dL — ABNORMAL HIGH (ref 70–99)
Potassium: 4.1 mEq/L (ref 3.5–5.1)
Sodium: 141 mEq/L (ref 135–145)
Total Bilirubin: 0.6 mg/dL (ref 0.2–1.2)
Total Protein: 6.8 g/dL (ref 6.0–8.3)

## 2022-03-13 LAB — TSH: TSH: 0.99 u[IU]/mL (ref 0.35–5.50)

## 2022-03-13 LAB — T4, FREE: Free T4: 0.9 ng/dL (ref 0.60–1.60)

## 2022-03-13 LAB — VITAMIN B12: Vitamin B-12: 416 pg/mL (ref 211–911)

## 2022-03-13 LAB — PSA: PSA: 0.09 ng/mL — ABNORMAL LOW (ref 0.10–4.00)

## 2022-03-14 ENCOUNTER — Other Ambulatory Visit: Payer: Self-pay | Admitting: Family Medicine

## 2022-03-17 ENCOUNTER — Encounter: Payer: Self-pay | Admitting: Family Medicine

## 2022-03-17 ENCOUNTER — Ambulatory Visit (INDEPENDENT_AMBULATORY_CARE_PROVIDER_SITE_OTHER): Payer: 59 | Admitting: Family Medicine

## 2022-03-17 VITALS — BP 134/86 | HR 57 | Temp 98.2°F | Ht 67.75 in | Wt 178.2 lb

## 2022-03-17 DIAGNOSIS — E538 Deficiency of other specified B group vitamins: Secondary | ICD-10-CM

## 2022-03-17 DIAGNOSIS — N401 Enlarged prostate with lower urinary tract symptoms: Secondary | ICD-10-CM

## 2022-03-17 DIAGNOSIS — Z23 Encounter for immunization: Secondary | ICD-10-CM

## 2022-03-17 DIAGNOSIS — Z9989 Dependence on other enabling machines and devices: Secondary | ICD-10-CM

## 2022-03-17 DIAGNOSIS — G4733 Obstructive sleep apnea (adult) (pediatric): Secondary | ICD-10-CM

## 2022-03-17 DIAGNOSIS — N138 Other obstructive and reflux uropathy: Secondary | ICD-10-CM

## 2022-03-17 DIAGNOSIS — E0500 Thyrotoxicosis with diffuse goiter without thyrotoxic crisis or storm: Secondary | ICD-10-CM

## 2022-03-17 DIAGNOSIS — E785 Hyperlipidemia, unspecified: Secondary | ICD-10-CM

## 2022-03-17 DIAGNOSIS — Z0000 Encounter for general adult medical examination without abnormal findings: Secondary | ICD-10-CM | POA: Diagnosis not present

## 2022-03-17 DIAGNOSIS — I10 Essential (primary) hypertension: Secondary | ICD-10-CM

## 2022-03-17 MED ORDER — TRIAMCINOLONE ACETONIDE 0.1 % EX CREA: 1.0000 "application " | TOPICAL_CREAM | Freq: Two times a day (BID) | CUTANEOUS | 0 refills | Status: AC

## 2022-03-17 MED ORDER — FINASTERIDE 5 MG PO TABS: 5.0000 mg | ORAL_TABLET | Freq: Every day | ORAL | 3 refills | Status: DC

## 2022-03-17 MED ORDER — METOPROLOL SUCCINATE ER 25 MG PO TB24: 12.5000 mg | ORAL_TABLET | Freq: Every day | ORAL | 3 refills | Status: DC

## 2022-03-17 MED ORDER — LOSARTAN POTASSIUM 50 MG PO TABS: 50.0000 mg | ORAL_TABLET | Freq: Every day | ORAL | 3 refills | Status: DC

## 2022-03-17 MED ORDER — AMLODIPINE BESYLATE 2.5 MG PO TABS: 2.5000 mg | ORAL_TABLET | Freq: Every day | ORAL | 3 refills | Status: DC

## 2022-03-17 MED ORDER — DOXAZOSIN MESYLATE 4 MG PO TABS: 4.0000 mg | ORAL_TABLET | Freq: Two times a day (BID) | ORAL | 3 refills | Status: DC

## 2022-03-17 NOTE — Assessment & Plan Note (Signed)
Chronic, stable on vit B12 daily.

## 2022-03-17 NOTE — Assessment & Plan Note (Signed)
Preventative protocols reviewed and updated unless pt declined. Discussed healthy diet and lifestyle.  

## 2022-03-17 NOTE — Assessment & Plan Note (Signed)
Appreciate endo care. Stable period off thyroid medication.

## 2022-03-17 NOTE — Assessment & Plan Note (Signed)
Chronic, stable on finasteride and cardura in place of flomax.

## 2022-03-17 NOTE — Assessment & Plan Note (Signed)
Chronic, stable off medication.  The 10-year ASCVD risk score (Arnett DK, et al., 2019) is: 4.3%   Values used to calculate the score:     Age: 56 years     Sex: Male     Is Non-Hispanic African American: No     Diabetic: No     Tobacco smoker: No     Systolic Blood Pressure: 997 mmHg     Is BP treated: Yes     HDL Cholesterol: 59.6 mg/dL     Total Cholesterol: 164 mg/dL

## 2022-03-17 NOTE — Patient Instructions (Addendum)
Tdap today.  Schedule visit with Dr Mortimer Fries for follow up sleep apnea/CPAP [phone].  We will await kidney artery ultrasound results.  Continue current medicines.  Return in  58month for follow up visit   Health Maintenance, Male Adopting a healthy lifestyle and getting preventive care are important in promoting health and wellness. Ask your health care provider about: The right schedule for you to have regular tests and exams. Things you can do on your own to prevent diseases and keep yourself healthy. What should I know about diet, weight, and exercise? Eat a healthy diet  Eat a diet that includes plenty of vegetables, fruits, low-fat dairy products, and lean protein. Do not eat a lot of foods that are high in solid fats, added sugars, or sodium. Maintain a healthy weight Body mass index (BMI) is a measurement that can be used to identify possible weight problems. It estimates body fat based on height and weight. Your health care provider can help determine your BMI and help you achieve or maintain a healthy weight. Get regular exercise Get regular exercise. This is one of the most important things you can do for your health. Most adults should: Exercise for at least 150 minutes each week. The exercise should increase your heart rate and make you sweat (moderate-intensity exercise). Do strengthening exercises at least twice a week. This is in addition to the moderate-intensity exercise. Spend less time sitting. Even light physical activity can be beneficial. Watch cholesterol and blood lipids Have your blood tested for lipids and cholesterol at 54years of age, then have this test every 5 years. You may need to have your cholesterol levels checked more often if: Your lipid or cholesterol levels are high. You are older than 54years of age. You are at high risk for heart disease. What should I know about cancer screening? Many types of cancers can be detected early and may often be  prevented. Depending on your health history and family history, you may need to have cancer screening at various ages. This may include screening for: Colorectal cancer. Prostate cancer. Skin cancer. Lung cancer. What should I know about heart disease, diabetes, and high blood pressure? Blood pressure and heart disease High blood pressure causes heart disease and increases the risk of stroke. This is more likely to develop in people who have high blood pressure readings or are overweight. Talk with your health care provider about your target blood pressure readings. Have your blood pressure checked: Every 3-5 years if you are [age]years of age. Every year if you are 467years old or older. If you are between the ages of 623and 766and are a current or former smoker, ask your health care provider if you should have a one-time screening for abdominal aortic aneurysm (AAA). Diabetes Have regular diabetes screenings. This checks your fasting blood sugar level. Have the screening done: Once every three years after age 1965if you are at a normal weight and have a low risk for diabetes. More often and at a younger age if you are overweight or have a high risk for diabetes. What should I know about preventing infection? Hepatitis B If you have a higher risk for hepatitis B, you should be screened for this virus. Talk with your health care provider to find out if you are at risk for hepatitis B infection. Hepatitis C Blood testing is recommended for: Everyone born from [birth year]through 1963. Anyone with known risk factors for hepatitis C. Sexually  transmitted infections (STIs) You should be screened each year for STIs, including gonorrhea and chlamydia, if: You are sexually active and are younger than 54 years of age. You are older than 54 years of age and your health care provider tells you that you are at risk for this type of infection. Your sexual activity has changed since you were last screened,  and you are at increased risk for chlamydia or gonorrhea. Ask your health care provider if you are at risk. Ask your health care provider about whether you are at high risk for HIV. Your health care provider may recommend a prescription medicine to help prevent HIV infection. If you choose to take medicine to prevent HIV, you should first get tested for HIV. You should then be tested every 3 months for as long as you are taking the medicine. Follow these instructions at home: Alcohol use Do not drink alcohol if your health care provider tells you not to drink. If you drink alcohol: Limit how much you have to 0-2 drinks a day. Know how much alcohol is in your drink. In the [place] one drink equals one 12 oz bottle of beer (355 mL), one 5 oz glass of wine (148 mL), or one 1 oz glass of hard liquor (44 mL). Lifestyle Do not use any products that contain nicotine or tobacco. These products include cigarettes, chewing tobacco, and vaping devices, such as e-cigarettes. If you need help quitting, ask your health care provider. Do not use street drugs. Do not share needles. Ask your health care provider for help if you need support or information about quitting drugs. General instructions Schedule regular health, dental, and eye exams. Stay current with your vaccines. Tell your health care provider if: You often feel depressed. You have ever been abused or do not feel safe at home. Summary Adopting a healthy lifestyle and getting preventive care are important in promoting health and wellness. Follow your health care provider's instructions about healthy diet, exercising, and getting tested or screened for diseases. Follow your health care provider's instructions on monitoring your cholesterol and blood pressure. This information is not intended to replace advice given to you by your health care provider. Make sure you discuss any questions you have with your health care provider. Document Revised:  02/28/2021 Document Reviewed: 02/28/2021 Elsevier Patient Education  [place].

## 2022-03-17 NOTE — Assessment & Plan Note (Addendum)
Regularly uses CPAP, followed by Dr Mortimer Fries, last seen 07/2019. Advised call and schedule f/u due to increasing daytime somnolence.

## 2022-03-17 NOTE — Progress Notes (Signed)
Patient ID: William Tucker, male    DOB: [birth year], 56 y.o.   MRN: [id number]  This visit was conducted in person.  BP 134/86   Pulse (!) 57   Temp 98.2 F (36.8 C) (Temporal)   Ht 5' 7.75" (1.721 m)   Wt 178 lb 4 oz (80.9 kg)   SpO2 98%   BMI 27.30 kg/m    CC: CPE Subjective:   HPI: William Tucker is a 56 y.o. male presenting on 03/17/2022 for Annual Exam (C/o feeling extremely sleepy when sitting still.  Has been going on for yrs. Also, wants to discuss lion's mane.  )   Retired from Event organiser 2021, now working at [employer].   He did start Lion's Mane - felt benefit after taking this supplement for 1 month.   Graves disease - sees endo William Tucker), off medications.  Difficult to treat HTN - currently managed on amlodipine 2.'5mg'$  daily, cardura '4mg'$  BID, losartan '50mg'$  daily, and Toprol XL 12.'5mg'$  daily. Brings BP log - 120-150s/60-90s, HR 70-80s over the past month.  Upcoming renal artery [place] scheduled next month.   Notes increased daytime somnolence when sitting still, longstanding issue. H/o OSA on CPAP through pulm ([facility]), last seen 07/2019. Recommendation was autoCPAP when last seen.  Patient bumped up CPAP pressure from 4 to 5 mmH2O.   BPH - stable period on finasteride and doxazosin.    Preventative: COLONOSCOPY 02/2018 - 3 benign polyps, rpt 5 yrs Carlean Purl) Prostate - checked yearly 2/2 alpha blocker and 5a reductase inhibitor for BPH.  Lung cancer screening - not eligible  Flu shot - declines  Tetanus - 1998, 2011. Tdap today  COVID vaccine - declined  Shingrix - 01/2020, 03/2020  Seat belt use discussed  Sunscreen use discussed. No changing moles on skin. Sees dermatologist yearly.  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 Interim medical history since our last visit reviewed. Allergies and medications reviewed and updated. Outpatient Medications Prior to Visit  Medication Sig Dispense Refill   Cholecalciferol (VITAMIN D3 PO) Take by mouth daily.     cyanocobalamin 1000 MCG tablet Take 1,000 mcg by mouth daily.     hydrOXYzine (ATARAX/VISTARIL) 25 MG tablet Take 1 tablet (25 mg total) by mouth 3 (three) times daily as needed. 30 tablet 0   loratadine (CLARITIN) 10 MG tablet Take 10 mg by mouth daily.     MISC NATURAL PRODUCTS PO Take by mouth daily. Lion's mane     Multiple Vitamins-Minerals (ZINC PO) Take by mouth daily.     Tadalafil 2.5 MG TABS TAKE 1 TABLET BY MOUTH  DAILY 90 tablet 0   amLODipine (NORVASC) 2.5 MG tablet Take 1 tablet (2.5 mg total) by mouth daily. 30 tablet 3   doxazosin (CARDURA) 4 MG tablet Take 1 tablet (4 mg total) by mouth 2 (two) times daily. 180 tablet 3   finasteride (PROSCAR) 5 MG tablet TAKE 1 TABLET BY MOUTH  DAILY 90 tablet 3   losartan (COZAAR) 50 MG tablet Take 1 tablet (50 mg total) by mouth daily. 30 tablet 6   metoprolol succinate (  TOPROL-XL) 25 MG 24 hr tablet Take 0.5 tablets (12.5 mg total) by mouth daily. 15 tablet 6   triamcinolone cream (KENALOG) 0.1 % Apply 1 application topically 2 (two) times daily. Apply to AA. 80 g 0   No facility-administered medications prior to visit.     Per HPI unless specifically indicated in ROS section below Review of Systems  Constitutional:  Negative for activity change, appetite change, chills, fatigue, fever and unexpected weight change.  HENT:  Negative for hearing loss.   Eyes:  Negative for visual disturbance.  Respiratory:  Negative for cough, chest tightness, shortness of breath and wheezing.   Cardiovascular:  Negative for chest pain, palpitations and leg swelling.  Gastrointestinal:  Negative  for abdominal distention, abdominal pain, blood in stool, constipation, diarrhea, nausea and vomiting.  Genitourinary:  Negative for difficulty urinating and hematuria.  Musculoskeletal:  Negative for arthralgias, myalgias and neck pain.  Skin:  Negative for rash.  Neurological:  Negative for dizziness, seizures, syncope and headaches.  Hematological:  Negative for adenopathy. Does not bruise/bleed easily.  Psychiatric/Behavioral:  Negative for dysphoric mood. The patient is not nervous/anxious.    Objective:  BP 134/86   Pulse (!) 57   Temp 98.2 F (36.8 C) (Temporal)   Ht 5' 7.75" (1.721 m)   Wt 178 lb 4 oz (80.9 kg)   SpO2 98%   BMI 27.30 kg/m   Wt Readings from Last 3 Encounters:  03/17/22 178 lb 4 oz (80.9 kg)  01/16/22 178 lb 2 oz (80.8 kg)  12/19/21 179 lb 9 oz (81.4 kg)      Physical Exam Vitals and nursing note reviewed.  Constitutional:      General: He is not in acute distress.    Appearance: Normal appearance. He is well-developed. He is not ill-appearing.  HENT:     Head: Normocephalic and atraumatic.     Right Ear: Hearing, tympanic membrane, ear canal and external ear normal.     Left Ear: Hearing, tympanic membrane, ear canal and external ear normal.  Eyes:     General: No scleral icterus.    Extraocular Movements: Extraocular movements intact.     Conjunctiva/sclera: Conjunctivae normal.     Pupils: Pupils are equal, round, and reactive to light.  Neck:     Thyroid: No thyroid mass or thyromegaly.  Cardiovascular:     Rate and Rhythm: Normal rate and regular rhythm.     Pulses: Normal pulses.          Radial pulses are 2+ on the right side and 2+ on the left side.     Heart sounds: Normal heart sounds. No murmur heard. Pulmonary:     Effort: Pulmonary effort is normal. No respiratory distress.     Breath sounds: Normal breath sounds. No wheezing, rhonchi or rales.  Abdominal:     General: Bowel sounds are normal. There is no distension.      Palpations: Abdomen is soft. There is no mass.     Tenderness: There is no abdominal tenderness. There is no guarding or rebound.     Hernia: No hernia is present.  Musculoskeletal:        General: Normal range of motion.     Cervical back: Normal range of motion and neck supple.     Right lower leg: No edema.     Left lower leg: No edema.  Lymphadenopathy:     Cervical: No cervical adenopathy.  Skin:    General: Skin is warm  and dry.     Findings: No rash.  Neurological:     General: No focal deficit present.     Mental Status: He is alert and oriented to person, place, and time.  Psychiatric:        Mood and Affect: Mood normal.        Behavior: Behavior normal.        Thought Content: Thought content normal.        Judgment: Judgment normal.      Results for orders placed or performed in visit on 03/13/22  T4, free  Result Value Ref Range   Free T4 0.90 0.60 - 1.60 ng/dL  TSH  Result Value Ref Range   TSH 0.99 0.35 - 5.50 uIU/mL  PSA  Result Value Ref Range   PSA 0.09 (L) 0.10 - 4.00 ng/mL  Comprehensive metabolic panel  Result Value Ref Range   Sodium 141 135 - 145 mEq/L   Potassium 4.1 3.5 - 5.1 mEq/L   Chloride 104 96 - 112 mEq/L   CO2 28 19 - 32 mEq/L   Glucose, Bld 107 (H) 70 - 99 mg/dL   BUN 16 6 - 23 mg/dL   Creatinine, Ser 1.44 0.40 - 1.50 mg/dL   Total Bilirubin 0.6 0.2 - 1.2 mg/dL   Alkaline Phosphatase 44 39 - 117 U/L   AST 15 0 - 37 U/L   ALT 12 0 - 53 U/L   Total Protein 6.8 6.0 - 8.3 g/dL   Albumin 4.6 3.5 - 5.2 g/dL   GFR 55.20 (L) >60.00 mL/min   Calcium 9.0 8.4 - 10.5 mg/dL  Lipid panel  Result Value Ref Range   Cholesterol 164 0 - 200 mg/dL   Triglycerides 59.0 0.0 - 149.0 mg/dL   HDL 59.60 >39.00 mg/dL   VLDL 11.8 0.0 - 40.0 mg/dL   LDL Cholesterol 92 0 - 99 mg/dL   Total CHOL/HDL Ratio 3    NonHDL 104.03   Vitamin B12  Result Value Ref Range   Vitamin B-12 416 211 - 911 pg/mL    Assessment & Plan:   Problem List Items Addressed  This Visit     Healthcare maintenance - Primary (Chronic)    Preventative protocols reviewed and updated unless pt declined. Discussed healthy diet and lifestyle.        HLD (hyperlipidemia)    Chronic, stable off medication.  The 10-year ASCVD risk score (Arnett DK, et al., 2019) is: 4.3%   Values used to calculate the score:     Age: 89 years     Sex: Male     Is Non-Hispanic African American: No     Diabetic: No     Tobacco smoker: No     Systolic Blood Pressure: 371 mmHg     Is BP treated: Yes     HDL Cholesterol: 59.6 mg/dL     Total Cholesterol: 164 mg/dL        Relevant Medications   amLODipine (NORVASC) 2.5 MG tablet   doxazosin (CARDURA) 4 MG tablet   losartan (COZAAR) 50 MG tablet   metoprolol succinate (TOPROL-XL) 25 MG 24 hr tablet   Benign prostatic hyperplasia with urinary obstruction    Chronic, stable on finasteride and cardura in place of flomax.        Relevant Medications   finasteride (PROSCAR) 5 MG tablet   Graves disease    Appreciate endo care. Stable period off thyroid medication.        Relevant  Medications   metoprolol succinate (TOPROL-XL) 25 MG 24 hr tablet   Vitamin B12 deficiency    Chronic, stable on vit B12 daily.        OSA on CPAP    Regularly uses CPAP, followed by Dr Mortimer Fries, last seen 07/2019. Advised call and schedule f/u due to increasing daytime somnolence.       Hypertension    Chronic, stable on 4 drug regimen.  Pending renal artery [place] next month.        Relevant Medications   amLODipine (NORVASC) 2.5 MG tablet   doxazosin (CARDURA) 4 MG tablet   losartan (COZAAR) 50 MG tablet   metoprolol succinate (TOPROL-XL) 25 MG 24 hr tablet   Other Visit Diagnoses     Need for Tdap vaccination       Relevant Orders   Tdap vaccine greater than or equal to 7yo IM (Completed)        Meds ordered this encounter  Medications   triamcinolone cream (KENALOG) 0.1 %    Sig: Apply 1 application. topically 2 (two) times  daily. Apply to AA.    Dispense:  80 g    Refill:  0   amLODipine (NORVASC) 2.5 MG tablet    Sig: Take 1 tablet (2.5 mg total) by mouth daily.    Dispense:  90 tablet    Refill:  3   doxazosin (CARDURA) 4 MG tablet    Sig: Take 1 tablet (4 mg total) by mouth 2 (two) times daily.    Dispense:  180 tablet    Refill:  3   finasteride (PROSCAR) 5 MG tablet    Sig: Take 1 tablet (5 mg total) by mouth daily.    Dispense:  90 tablet    Refill:  3   losartan (COZAAR) 50 MG tablet    Sig: Take 1 tablet (50 mg total) by mouth daily.    Dispense:  90 tablet    Refill:  3   metoprolol succinate (TOPROL-XL) 25 MG 24 hr tablet    Sig: Take 0.5 tablets (12.5 mg total) by mouth daily.    Dispense:  45 tablet    Refill:  3   Orders Placed This Encounter  Procedures   Tdap vaccine greater than or equal to 7yo IM    Patient instructions: Tdap today.  Schedule visit with Dr Mortimer Fries for follow up sleep apnea/CPAP [phone].  We will await kidney artery ultrasound results.  Continue current medicines.  Return in  4month for follow up visit   Follow up plan: Return in about 6 months (around 09/17/2022) for follow up visit.  JRia Bush MD

## 2022-03-17 NOTE — Assessment & Plan Note (Addendum)
Chronic, stable on 4 drug regimen.  Pending renal artery [place] next month.

## 2022-03-30 ENCOUNTER — Ambulatory Visit (HOSPITAL_COMMUNITY)
Admission: RE | Admit: 2022-03-30 | Discharge: 2022-03-30 | Disposition: A | Discharge status: Home / Self Care | Payer: 59 | Source: Ambulatory Visit | Attending: Family Medicine | Admitting: Family Medicine

## 2022-03-30 DIAGNOSIS — I159 Secondary hypertension, unspecified: Secondary | ICD-10-CM | POA: Diagnosis not present

## 2022-06-04 ENCOUNTER — Other Ambulatory Visit: Payer: Self-pay | Admitting: Family Medicine

## 2022-07-12 ENCOUNTER — Telehealth: Payer: Self-pay | Admitting: Family Medicine

## 2022-07-12 MED ORDER — TAMSULOSIN HCL 0.4 MG PO CAPS: 0.4000 mg | ORAL_CAPSULE | Freq: Every day | ORAL | 3 refills | Status: DC

## 2022-07-12 MED ORDER — AMLODIPINE BESYLATE 10 MG PO TABS: 10.0000 mg | ORAL_TABLET | Freq: Every day | ORAL | 3 refills | Status: DC

## 2022-07-12 NOTE — Telephone Encounter (Signed)
Spoke with pt at [facility].  BPs have been consistently fluctuating [phone] systolic.  He's currently off all meds (previously prescribed amlodipine 2.'5mg'$ , cardura '4mg'$  bid, losartan '50mg'$  daily, toprol XL 12.'5mg'$  daily).  Asks to restart full dose amlodipine. He also restarted flomax.   Will restart amlodipine '10mg'$  daily+ flomax 0.'4mg'$  daily. Will also continue Toprol XL 12.'5mg'$  nightly.

## 2022-08-30 ENCOUNTER — Telehealth: Payer: Self-pay

## 2022-08-30 DIAGNOSIS — I10 Essential (primary) hypertension: Secondary | ICD-10-CM

## 2022-08-30 MED ORDER — AMLODIPINE BESYLATE 10 MG PO TABS: 10.0000 mg | ORAL_TABLET | Freq: Every day | ORAL | 1 refills | Status: DC

## 2022-08-30 NOTE — Telephone Encounter (Signed)
E-scribed refill 

## 2022-09-18 ENCOUNTER — Ambulatory Visit: Payer: 59 | Admitting: Family Medicine

## 2022-09-18 ENCOUNTER — Encounter: Payer: Self-pay | Admitting: Family Medicine

## 2022-09-18 ENCOUNTER — Other Ambulatory Visit: Payer: Self-pay

## 2022-09-18 VITALS — BP 118/72 | HR 68 | Temp 97.5°F | Ht 67.75 in | Wt 171.0 lb

## 2022-09-18 DIAGNOSIS — G4733 Obstructive sleep apnea (adult) (pediatric): Secondary | ICD-10-CM

## 2022-09-18 DIAGNOSIS — M545 Low back pain, unspecified: Secondary | ICD-10-CM

## 2022-09-18 DIAGNOSIS — I10 Essential (primary) hypertension: Secondary | ICD-10-CM

## 2022-09-18 MED ORDER — AMLODIPINE BESYLATE 10 MG PO TABS: 10.0000 mg | ORAL_TABLET | Freq: Every day | ORAL | 1 refills | Status: DC

## 2022-09-18 MED ORDER — METOPROLOL SUCCINATE ER 25 MG PO TB24: 12.5000 mg | ORAL_TABLET | Freq: Every day | ORAL | 1 refills | Status: DC

## 2022-09-18 MED ORDER — TAMSULOSIN HCL 0.4 MG PO CAPS: 0.4000 mg | ORAL_CAPSULE | Freq: Every day | ORAL | 2 refills | Status: DC

## 2022-09-18 NOTE — Progress Notes (Signed)
Patient ID: William Tucker, male    DOB: Mar 13, 1966, 56 y.o.   MRN: [id number]  This visit was conducted in person.  BP 118/72 (BP Location: Left Arm, Patient Position: Sitting)   Pulse 68   Temp (!) 97.5 F (36.4 C) (Skin)   Ht 5' 7.75" (1.721 m)   Wt 171 lb (77.6 kg)   SpO2 98%   BMI 26.19 kg/m    CC: HTN f/u visit  Subjective:   HPI: William Tucker is a 56 y.o. male presenting on 09/18/2022 for Follow-up (Blood pressure)   HTN - Compliant with current antihypertensive regimen of amlodipine '10mg'$  daily, Toprol XL 12.'5mg'$  nightly. Doing well without rash. Previously on cardura '4mg'$  bid and losartan '50mg'$  daily as well - but currently off these medications. Does check blood pressures at home: 135/85. No low blood pressure readings or symptoms of dizziness/syncope. Denies HA, vision changes, CP/tightness, SOB, leg swelling. Renal artery ultrasound (03/2022) negative for renal artery stenosis. Notes some recent weight loss as well.   Known Graves disease followed by endocrinology Cruzita Lederer)  Severe OSA on CPAP ([facility]) - due for f/u.   2 wk h/o L lower back discomfort described as "separation". No shooting pain down legs, numbness or weakness of legs, bowel/bladder incontinence.     Relevant past medical, surgical, family and social history reviewed and updated as indicated. Interim medical history since our last visit reviewed. Allergies and medications reviewed and updated. Outpatient Medications Prior to Visit  Medication Sig Dispense Refill   Cholecalciferol (VITAMIN D3 PO) Take by mouth daily.     cyanocobalamin 1000 MCG tablet Take 1,000 mcg by mouth daily.     finasteride (PROSCAR) 5 MG tablet Take 1 tablet (5 mg total) by mouth daily. 90 tablet 3   hydrOXYzine (ATARAX/VISTARIL) 25 MG tablet Take 1 tablet (25 mg total) by mouth 3 (three) times daily as needed. 30 tablet 0   loratadine (CLARITIN) 10 MG tablet Take 10 mg by mouth daily.     Multiple Vitamins-Minerals (ZINC  PO) Take by mouth daily.     Tadalafil 2.5 MG TABS TAKE 1 TABLET BY MOUTH DAILY 90 tablet 3   tamsulosin (FLOMAX) 0.4 MG CAPS capsule Take 1 capsule (0.4 mg total) by mouth daily. 30 capsule 3   triamcinolone cream (KENALOG) 0.1 % Apply 1 application. topically 2 (two) times daily. Apply to AA. 80 g 0   amLODipine (NORVASC) 10 MG tablet Take 1 tablet (10 mg total) by mouth daily. 90 tablet 1   metoprolol succinate (TOPROL-XL) 25 MG 24 hr tablet Take 0.5 tablets (12.5 mg total) by mouth daily. 45 tablet 3   MISC NATURAL PRODUCTS PO Take by mouth daily. Lion's mane (Patient not taking: Reported on 09/18/2022)     No facility-administered medications prior to visit.     Per HPI unless specifically indicated in ROS section below Review of Systems  Objective:  BP 118/72 (BP Location: Left Arm, Patient Position: Sitting)   Pulse 68   Temp (!) 97.5 F (36.4 C) (Skin)   Ht 5' 7.75" (1.721 m)   Wt 171 lb (77.6 kg)   SpO2 98%   BMI 26.19 kg/m   Wt Readings from Last 3 Encounters:  09/18/22 171 lb (77.6 kg)  03/17/22 178 lb 4 oz (80.9 kg)  01/16/22 178 lb 2 oz (80.8 kg)      Physical Exam Vitals and nursing note reviewed.  Constitutional:      Appearance: Normal appearance. He  is not ill-appearing.  HENT:     Mouth/Throat:     Mouth: Mucous membranes are moist.     Pharynx: No oropharyngeal exudate or posterior oropharyngeal erythema.  Eyes:     Extraocular Movements: Extraocular movements intact.     Conjunctiva/sclera: Conjunctivae normal.     Pupils: Pupils are equal, round, and reactive to light.  Cardiovascular:     Rate and Rhythm: Normal rate.     Pulses: Normal pulses.     Heart sounds: Normal heart sounds. No murmur heard. Pulmonary:     Effort: Pulmonary effort is normal. No respiratory distress.     Breath sounds: Normal breath sounds. No wheezing, rhonchi or rales.  Musculoskeletal:        General: No tenderness. Normal range of motion.       Arms:     Cervical  back: Normal range of motion and neck supple.     Right lower leg: No edema.     Left lower leg: No edema.     Comments:  No pain midline spine No paraspinous mm tenderness Discomfort to left lower back lateral of midline and paraspinous mm Neg SLR bilaterally. No pain with int/ext rotation at hip. Neg FABER. No pain at SIJ, GTB or sciatic notch bilaterally.   Skin:    General: Skin is warm and dry.     Findings: No rash.  Neurological:     Mental Status: He is alert.     Sensory: Sensation is intact.     Motor: Motor function is intact.     Coordination: Coordination is intact.     Gait: Gait is intact.     Comments:  5/5 strength BLE Sensation intact  Psychiatric:        Mood and Affect: Mood normal.        Behavior: Behavior normal.        Assessment & Plan:   Problem List Items Addressed This Visit       Unprioritized   OSA on CPAP    Continues using CPAP regularly. Due for pulm f/u.       Hypertension - Primary    Chronic, well controlled on amlodipine and toprol XL, tolerating medications well - continue current regimen.       Relevant Medications   amLODipine (NORVASC) 10 MG tablet   metoprolol succinate (TOPROL-XL) 25 MG 24 hr tablet   Left low back pain    Anticipate lumbar strain - provided with stretching exercises from [facility] pt advisor for lower back pain, discussed heating pad use. Update if not improving with treatment.         Meds ordered this encounter  Medications   amLODipine (NORVASC) 10 MG tablet    Sig: Take 1 tablet (10 mg total) by mouth daily.    Dispense:  90 tablet    Refill:  1   metoprolol succinate (TOPROL-XL) 25 MG 24 hr tablet    Sig: Take 0.5 tablets (12.5 mg total) by mouth daily.    Dispense:  45 tablet    Refill:  1   No orders of the defined types were placed in this encounter.    Patient Instructions  Call for appointment with Dr Mortimer Fries - new referral placed.  You are doing well today.  Continue current medicines.   For left lower back pain - try exercises provided today Return in 6 months for physical.   Follow up plan: Return in about 6 months (around 03/19/2023) for annual  exam, prior fasting for blood work.  Ria Bush, MD

## 2022-09-18 NOTE — Assessment & Plan Note (Addendum)
Continues using CPAP regularly. Due for pulm f/u.

## 2022-09-18 NOTE — Assessment & Plan Note (Signed)
Anticipate lumbar strain - provided with stretching exercises from [facility] pt advisor for lower back pain, discussed heating pad use. Update if not improving with treatment.

## 2022-09-18 NOTE — Patient Instructions (Addendum)
Call for appointment with Dr Mortimer Fries - new referral placed.  You are doing well today.  Continue current medicines.  For left lower back pain - try exercises provided today Return in 6 months for physical.

## 2022-09-18 NOTE — Assessment & Plan Note (Signed)
Chronic, well controlled on amlodipine and toprol XL, tolerating medications well - continue current regimen.

## 2023-01-18 ENCOUNTER — Other Ambulatory Visit: Payer: Self-pay | Admitting: Family Medicine

## 2023-01-18 DIAGNOSIS — I10 Essential (primary) hypertension: Secondary | ICD-10-CM

## 2023-03-08 ENCOUNTER — Other Ambulatory Visit: Payer: Self-pay | Admitting: Family Medicine

## 2023-03-08 DIAGNOSIS — E538 Deficiency of other specified B group vitamins: Secondary | ICD-10-CM

## 2023-03-08 DIAGNOSIS — N138 Other obstructive and reflux uropathy: Secondary | ICD-10-CM

## 2023-03-08 DIAGNOSIS — E785 Hyperlipidemia, unspecified: Secondary | ICD-10-CM

## 2023-03-08 DIAGNOSIS — E0500 Thyrotoxicosis with diffuse goiter without thyrotoxic crisis or storm: Secondary | ICD-10-CM

## 2023-03-12 ENCOUNTER — Other Ambulatory Visit (INDEPENDENT_AMBULATORY_CARE_PROVIDER_SITE_OTHER): Payer: 59

## 2023-03-12 DIAGNOSIS — E785 Hyperlipidemia, unspecified: Secondary | ICD-10-CM | POA: Diagnosis not present

## 2023-03-12 DIAGNOSIS — N401 Enlarged prostate with lower urinary tract symptoms: Secondary | ICD-10-CM

## 2023-03-12 DIAGNOSIS — N138 Other obstructive and reflux uropathy: Secondary | ICD-10-CM

## 2023-03-12 DIAGNOSIS — E538 Deficiency of other specified B group vitamins: Secondary | ICD-10-CM | POA: Diagnosis not present

## 2023-03-12 DIAGNOSIS — E0500 Thyrotoxicosis with diffuse goiter without thyrotoxic crisis or storm: Secondary | ICD-10-CM

## 2023-03-12 LAB — COMPREHENSIVE METABOLIC PANEL
ALT: 17 U/L (ref 0–53)
AST: 16 U/L (ref 0–37)
Albumin: 4.5 g/dL (ref 3.5–5.2)
Alkaline Phosphatase: 53 U/L (ref 39–117)
BUN: 14 mg/dL (ref 6–23)
CO2: 28 mEq/L (ref 19–32)
Calcium: 8.9 mg/dL (ref 8.4–10.5)
Chloride: 105 mEq/L (ref 96–112)
Creatinine, Ser: 1.34 mg/dL (ref 0.40–1.50)
GFR: 59.76 mL/min — ABNORMAL LOW (ref >60.00)
Glucose, Bld: 99 mg/dL (ref 70–99)
Potassium: 4 mEq/L (ref 3.5–5.1)
Sodium: 141 mEq/L (ref 135–145)
Total Bilirubin: 0.7 mg/dL (ref 0.2–1.2)
Total Protein: 6.7 g/dL (ref 6.0–8.3)

## 2023-03-12 LAB — LIPID PANEL
Cholesterol: 167 mg/dL (ref 0–200)
HDL: 46.9 mg/dL (ref >39.00)
LDL Cholesterol: 107 mg/dL — ABNORMAL HIGH (ref 0–99)
NonHDL: 120.15
Total CHOL/HDL Ratio: 4
Triglycerides: 66 mg/dL (ref 0.0–149.0)
VLDL: 13.2 mg/dL (ref 0.0–40.0)

## 2023-03-13 LAB — T3: T3, Total: 105 ng/dL (ref 76–181)

## 2023-03-13 LAB — VITAMIN B12: Vitamin B-12: 310 pg/mL (ref 211–911)

## 2023-03-13 LAB — T4, FREE: Free T4: 0.72 ng/dL (ref 0.60–1.60)

## 2023-03-13 LAB — TSH: TSH: 0.57 u[IU]/mL (ref 0.35–5.50)

## 2023-03-13 LAB — PSA: PSA: 0.25 ng/mL (ref 0.10–4.00)

## 2023-03-23 ENCOUNTER — Encounter: Payer: 59 | Admitting: Family Medicine

## 2023-03-26 ENCOUNTER — Encounter: Payer: Self-pay | Admitting: Family Medicine

## 2023-03-26 ENCOUNTER — Ambulatory Visit (INDEPENDENT_AMBULATORY_CARE_PROVIDER_SITE_OTHER): Payer: 59 | Admitting: Family Medicine

## 2023-03-26 VITALS — BP 118/62 | HR 61 | Temp 98.6°F | Ht 68.5 in | Wt 175.0 lb

## 2023-03-26 DIAGNOSIS — N138 Other obstructive and reflux uropathy: Secondary | ICD-10-CM

## 2023-03-26 DIAGNOSIS — N401 Enlarged prostate with lower urinary tract symptoms: Secondary | ICD-10-CM

## 2023-03-26 DIAGNOSIS — Z1211 Encounter for screening for malignant neoplasm of colon: Secondary | ICD-10-CM | POA: Diagnosis not present

## 2023-03-26 DIAGNOSIS — J302 Other seasonal allergic rhinitis: Secondary | ICD-10-CM

## 2023-03-26 DIAGNOSIS — I10 Essential (primary) hypertension: Secondary | ICD-10-CM

## 2023-03-26 DIAGNOSIS — E0500 Thyrotoxicosis with diffuse goiter without thyrotoxic crisis or storm: Secondary | ICD-10-CM

## 2023-03-26 DIAGNOSIS — G4733 Obstructive sleep apnea (adult) (pediatric): Secondary | ICD-10-CM

## 2023-03-26 DIAGNOSIS — E785 Hyperlipidemia, unspecified: Secondary | ICD-10-CM

## 2023-03-26 DIAGNOSIS — Z0000 Encounter for general adult medical examination without abnormal findings: Secondary | ICD-10-CM | POA: Diagnosis not present

## 2023-03-26 DIAGNOSIS — E538 Deficiency of other specified B group vitamins: Secondary | ICD-10-CM

## 2023-03-26 MED ORDER — METOPROLOL SUCCINATE ER 25 MG PO TB24: 12.5000 mg | ORAL_TABLET | Freq: Every day | ORAL | 4 refills | Status: DC

## 2023-03-26 MED ORDER — AMLODIPINE BESYLATE 10 MG PO TABS: 10.0000 mg | ORAL_TABLET | Freq: Every day | ORAL | 4 refills | Status: DC

## 2023-03-26 NOTE — Patient Instructions (Addendum)
You may call [facility] to schedule an appointment at [phone].  Continue current medicines and supplements.  Good to see you today Return as needed or in 1 year for next physical

## 2023-03-26 NOTE — Assessment & Plan Note (Signed)
Low normal readings - continue b12 daily.

## 2023-03-26 NOTE — Assessment & Plan Note (Signed)
Chronic stable off medication. The 10-year ASCVD risk score (Arnett DK, et al., 2019) is: 4.9%   Values used to calculate the score:     Age: 57 years     Sex: Male     Is Non-Hispanic African American: No     Diabetic: No     Tobacco smoker: No     Systolic Blood Pressure: 118 mmHg     Is BP treated: Yes     HDL Cholesterol: 46.9 mg/dL     Total Cholesterol: 167 mg/dL

## 2023-03-26 NOTE — Assessment & Plan Note (Addendum)
Chronic, stable on finasteride and flomax.

## 2023-03-26 NOTE — Assessment & Plan Note (Signed)
Continues CPAP regularly.  

## 2023-03-26 NOTE — Assessment & Plan Note (Signed)
Chronic, stable on current regimen.  

## 2023-03-26 NOTE — Assessment & Plan Note (Signed)
Continues flonase OTC and claritin

## 2023-03-26 NOTE — Assessment & Plan Note (Signed)
Stable TFTs. Last saw endo 2021

## 2023-03-26 NOTE — Assessment & Plan Note (Signed)
Preventative protocols reviewed and updated unless pt declined. Discussed healthy diet and lifestyle.  

## 2023-03-26 NOTE — Progress Notes (Signed)
Ph: [phone] Fax: [phone]   Patient ID: William Tucker, male    DOB: January 30, 1966, 57 y.o.   MRN: [id number]  This visit was conducted in person.  BP 118/62   Pulse 61   Temp 98.6 F (37 C) (Temporal)   Ht 5' 8.5" (1.74 m)   Wt 175 lb (79.4 kg)   SpO2 97%   BMI 26.22 kg/m    CC: CPE Subjective:   HPI: William Tucker is a 57 y.o. male presenting on 03/26/2023 for Annual Exam   Retired from Patent examiner 2021, now works at [employer].   Graves disease - sees endo Elvera Lennox), off medications. Last seen 2021.   HTN - continues amlodipine 10mg  daily, toprol XL 12.5mg  daily. Tolerating meds well. Previous poor response to losartan and lisinopril.    H/o OSA on CPAP through pulm ([facility]), last seen 07/2019. Recommendation was autoCPAP when last seen.  Patient bumped up CPAP pressure from 4 to 5 cm H2O.   BPH - stable period on finasteride and doxazosin.   Preventative: COLONOSCOPY 02/2018 - HT, TAx2, rpt 5 yrs Leone Payor) Prostate - yearly PSA on alpha blocker and 5a reductase inhibitor for BPH.  Lung cancer screening - not eligible  Flu shot - declines  Tetanus - 1998, 2011. Tdap 02/2022 COVID vaccine - declined  Shingrix - 01/2020, 03/2020  Seat belt use discussed  Sunscreen use discussed. No changing moles on skin. Sees dermatologist yearly.  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 Interim medical history since our last visit reviewed. Allergies and medications reviewed and updated. Outpatient Medications Prior to Visit  Medication Sig Dispense Refill   Cholecalciferol (VITAMIN D3 PO) Take by mouth daily.      cyanocobalamin 1000 MCG tablet Take 1,000 mcg by mouth daily.     finasteride (PROSCAR) 5 MG tablet Take 1 tablet (5 mg total) by mouth daily. 90 tablet 3   loratadine (CLARITIN) 10 MG tablet Take 10 mg by mouth daily.     Multiple Vitamins-Minerals (ZINC PO) Take by mouth daily.     Tadalafil 2.5 MG TABS TAKE 1 TABLET BY MOUTH DAILY 90 tablet 3   tamsulosin (FLOMAX) 0.4 MG CAPS capsule Take 1 capsule (0.4 mg total) by mouth daily. 90 capsule 2   amLODipine (NORVASC) 10 MG tablet TAKE 1 TABLET BY MOUTH EVERY DAY 30 tablet 2   metoprolol succinate (TOPROL-XL) 25 MG 24 hr tablet Take 0.5 tablets (12.5 mg total) by mouth daily. 45 tablet 1   hydrOXYzine (ATARAX/VISTARIL) 25 MG tablet Take 1 tablet (25 mg total) by mouth 3 (three) times daily as needed. (Patient not taking: Reported on 03/26/2023) 30 tablet 0   No facility-administered medications prior to visit.     Per HPI unless specifically indicated in ROS section below Review of Systems  Constitutional:  Negative for activity change, appetite change, chills, fatigue, fever and unexpected weight change.  HENT:  Positive for congestion (allergy). Negative for hearing loss.   Eyes:  Negative for visual disturbance.  Respiratory:  Positive for cough (occ). Negative for chest tightness, shortness of breath and wheezing.  Cardiovascular:  Negative for chest pain, palpitations and leg swelling.  Gastrointestinal:  Negative for abdominal distention, abdominal pain, blood in stool, constipation, diarrhea, nausea and vomiting.  Genitourinary:  Negative for difficulty urinating and hematuria.  Musculoskeletal:  Negative for arthralgias, myalgias and neck pain.  Skin:  Negative for rash.  Neurological:  Negative for dizziness, seizures, syncope and headaches.  Hematological:  Negative for adenopathy. Does not bruise/bleed easily.  Psychiatric/Behavioral:  Negative for dysphoric mood. The patient is not nervous/anxious.     Objective:  BP 118/62    Pulse 61   Temp 98.6 F (37 C) (Temporal)   Ht 5' 8.5" (1.74 m)   Wt 175 lb (79.4 kg)   SpO2 97%   BMI 26.22 kg/m   Wt Readings from Last 3 Encounters:  03/26/23 175 lb (79.4 kg)  09/18/22 171 lb (77.6 kg)  03/17/22 178 lb 4 oz (80.9 kg)      Physical Exam Vitals and nursing note reviewed.  Constitutional:      General: He is not in acute distress.    Appearance: Normal appearance. He is well-developed. He is not ill-appearing.  HENT:     Head: Normocephalic and atraumatic.     Right Ear: Hearing, tympanic membrane, ear canal and external ear normal.     Left Ear: Hearing, tympanic membrane, ear canal and external ear normal.     Nose: Nose normal.     Mouth/Throat:     Mouth: Mucous membranes are moist.     Pharynx: Oropharynx is clear. No oropharyngeal exudate or posterior oropharyngeal erythema.  Eyes:     General: No scleral icterus.    Extraocular Movements: Extraocular movements intact.     Conjunctiva/sclera: Conjunctivae normal.     Pupils: Pupils are equal, round, and reactive to light.  Neck:     Thyroid: No thyroid mass or thyromegaly.  Cardiovascular:     Rate and Rhythm: Normal rate and regular rhythm.     Pulses: Normal pulses.          Radial pulses are 2+ on the right side and 2+ on the left side.     Heart sounds: Normal heart sounds. No murmur heard. Pulmonary:     Effort: Pulmonary effort is normal. No respiratory distress.     Breath sounds: Normal breath sounds. No wheezing, rhonchi or rales.  Abdominal:     General: Bowel sounds are normal. There is no distension.     Palpations: Abdomen is soft. There is no mass.     Tenderness: There is no abdominal tenderness. There is no guarding or rebound.     Hernia: No hernia is present.  Musculoskeletal:        General: Normal range of motion.     Cervical back: Normal range of motion and neck supple.     Right lower leg: No edema.     Left lower leg: No edema.  Lymphadenopathy:     Cervical: No  cervical adenopathy.  Skin:    General: Skin is warm and dry.     Findings: No rash.  Neurological:     General: No focal deficit present.     Mental Status: He is alert and oriented to person, place, and time.  Psychiatric:        Mood and Affect: Mood normal.        Behavior: Behavior normal.        Thought Content: Thought content normal.  Judgment: Judgment normal.       Results for orders placed or performed in visit on 03/12/23  T3  Result Value Ref Range   T3, Total 105 76 - 181 ng/dL  T4, free  Result Value Ref Range   Free T4 0.72 0.60 - 1.60 ng/dL  Vitamin W09  Result Value Ref Range   Vitamin B-12 310 211 - 911 pg/mL  TSH  Result Value Ref Range   TSH 0.57 0.35 - 5.50 uIU/mL  PSA  Result Value Ref Range   PSA 0.25 0.10 - 4.00 ng/mL  Comprehensive metabolic panel  Result Value Ref Range   Sodium 141 135 - 145 mEq/L   Potassium 4.0 3.5 - 5.1 mEq/L   Chloride 105 96 - 112 mEq/L   CO2 28 19 - 32 mEq/L   Glucose, Bld 99 70 - 99 mg/dL   BUN 14 6 - 23 mg/dL   Creatinine, Ser 8.11 0.40 - 1.50 mg/dL   Total Bilirubin 0.7 0.2 - 1.2 mg/dL   Alkaline Phosphatase 53 39 - 117 U/L   AST 16 0 - 37 U/L   ALT 17 0 - 53 U/L   Total Protein 6.7 6.0 - 8.3 g/dL   Albumin 4.5 3.5 - 5.2 g/dL   GFR 91.47 (L) >82.95 mL/min   Calcium 8.9 8.4 - 10.5 mg/dL  Lipid panel  Result Value Ref Range   Cholesterol 167 0 - 200 mg/dL   Triglycerides 62.1 0.0 - 149.0 mg/dL   HDL 30.86 >57.84 mg/dL   VLDL 69.6 0.0 - 29.5 mg/dL   LDL Cholesterol 284 (H) 0 - 99 mg/dL   Total CHOL/HDL Ratio 4    NonHDL 120.15     Assessment & Plan:   Problem List Items Addressed This Visit     Healthcare maintenance - Primary (Chronic)    Preventative protocols reviewed and updated unless pt declined. Discussed healthy diet and lifestyle.       HLD (hyperlipidemia)    Chronic stable off medication. The 10-year ASCVD risk score (Arnett DK, et al., 2019) is: 4.9%   Values used to calculate  the score:     Age: 70 years     Sex: Male     Is Non-Hispanic African American: No     Diabetic: No     Tobacco smoker: No     Systolic Blood Pressure: 118 mmHg     Is BP treated: Yes     HDL Cholesterol: 46.9 mg/dL     Total Cholesterol: 167 mg/dL       Relevant Medications   amLODipine (NORVASC) 10 MG tablet   metoprolol succinate (TOPROL-XL) 25 MG 24 hr tablet   Allergic rhinitis    Continues flonase OTC and claritin       Benign prostatic hyperplasia with urinary obstruction    Chronic, stable on finasteride and flomax.       Graves disease    Stable TFTs. Last saw endo 2021      Relevant Medications   metoprolol succinate (TOPROL-XL) 25 MG 24 hr tablet   Vitamin B12 deficiency    Low normal readings - continue b12 daily.       OSA on CPAP    Continues CPAP regularly.       Hypertension    Chronic, stable on current regimen.       Relevant Medications   amLODipine (NORVASC) 10 MG tablet   metoprolol succinate (TOPROL-XL) 25 MG 24 hr tablet  Other Visit Diagnoses     Special screening for malignant neoplasms, colon       Relevant Orders   Ambulatory referral to Gastroenterology        Meds ordered this encounter  Medications   amLODipine (NORVASC) 10 MG tablet    Sig: Take 1 tablet (10 mg total) by mouth daily.    Dispense:  90 tablet    Refill:  4   metoprolol succinate (TOPROL-XL) 25 MG 24 hr tablet    Sig: Take 0.5 tablets (12.5 mg total) by mouth daily.    Dispense:  45 tablet    Refill:  4    Orders Placed This Encounter  Procedures   Ambulatory referral to Gastroenterology    Referral Priority:   Routine    Referral Type:   Consultation    Referral Reason:   Specialty Services Required    Number of Visits Requested:   1    Patient Instructions  You may call  GI to schedule an appointment at [phone].  Continue current medicines and supplements.  Good to see you today Return as needed or in 1 year for next  physical  Follow up plan: Return in about 1 year (around 03/25/2024), or if symptoms worsen or fail to improve, for annual exam, prior fasting for blood work.  Eustaquio Boyden, MD

## 2023-04-03 NOTE — Progress Notes (Signed)
William Lindy T. Safiyah Cisney, MD, CAQ Sports Medicine [facility] [address] [place], [address]  Phone: [phone]  FAX: [phone]  William Tucker - 57 y.o. male  MRN [id number]  Date of Birth: 09/16/1966  Date: 04/04/2023  PCP: William Boyden, MD  Referral: William Boyden, MD  Chief Complaint  Patient presents with   Elbow Pain    Right   Shoulder Pain    Left   Back Pain    Lower Left Back   Subjective:   William Tucker is a 57 y.o. very pleasant male patient with Body mass index is 26.28 kg/m. who presents with the following:  He presents today with ongoing joint pain and a question of possible corticosteroid injection.  On further questioning, he actually is here for multiple joint pain, polyarthralgia.  R sided elbow pain, some at the LE and distal biceps and triceps.   He has been doing some Holiday representative.  Forward pressure, with using a bill.  Wants a steroid injection.  His predominant area of pain is at the lateral epicondylar region on the right elbow.  He has no significant tenderness at the medial epicondyle.  He does have full range of motion at the elbow, and he has some pain distal to the lateral epicondyle, as well.  Now left shoulder is also hurting.  Chiropractor has been working with him, too.  Also, he had a massage therapy.  He has a deep dull ache in the left shoulder, pain with terminal abduction and internal range of motion.  There is a global tenderness and ache in and about the intra-articular shoulder. No significant neck pain.  No shoulder blade pain and radicular pains.  Also with some 4 months of low back pain.  No radiation.  He has an ache in the lowest aspect of his lumbar spine in and about the paraspinous musculature.  He denies any numbness, tingling, or radicular pains in the lower extremity.  Also with decreased hearing.   RTC tendonitis, L shoulder  Inject LE, R Injection L  shoulder  Review of Systems is noted in the HPI, as appropriate  Objective:   BP 108/68 (BP Location: Left Arm, Patient Position: Sitting, Cuff Size: Normal)   Pulse 69   Temp 98.1 F (36.7 C) (Temporal)   Ht 5' 8.5" (1.74 m)   Wt 175 lb 6 oz (79.5 kg)   SpO2 98%   BMI 26.28 kg/m   GEN: No acute distress; alert,appropriate. PULM: Breathing comfortably in no respiratory distress PSYCH: Normally interactive.   Shoulder: L Inspection: No muscle wasting or winging Ecchymosis/edema: neg  AC joint, scapula, clavicle: NT Cervical spine: NT, full ROM Spurling's: neg Abduction: full, 5/5 Flexion: full, 5/5 IR, full, lift-off: 5/5 ER at neutral: full, 5/5 AC crossover: neg Neer: pos Hawkins: pos Drop Test: neg Empty Can: pos Supraspinatus insertion: mild-mod T Bicipital groove: NT Speed's: neg Yergason's: neg Sulcus sign: neg Scapular dyskinesis: none C5-T1 intact  Neuro: Sensation intact Grip 5/5   Elbow: R Ecchymosis or edema: neg ROM: full flexion, extension, pronation, supination Shoulder ROM: Full Flexion: 5/5 Extension: 5/5, PAINFUL Supination: 5/5, PAINFUL Pronation: 5/5 Wrist ext: 5/5 Wrist flexion: 5/5 No gross bony abnormality Varus and Valgus stress: stable ECRB tenderness: YES, TTP Medial epicondyle: NT Lateral epicondyle, resisted wrist extension from wrist full pronation and flexion: PAINFUL  Tinel's, Elbow: negative     Range of motion at  the waist: Flexion: normal Extension: normal  Lateral bending: normal Rotation: all normal  No echymosis or edema Rises to examination table with no difficulty Gait: non antalgic  Inspection/Deformity: N Paraspinus Tenderness: L3-S1  B Ankle Dorsiflexion (L5,4): 5/5 B Great Toe Dorsiflexion (L5,4): 5/5 Heel Walk (L5): WNL Toe Walk (S1): WNL Rise/Squat (L4): WNL  SENSORY B Medial Foot (L4): WNL B Dorsum (L5): WNL B Lateral (S1): WNL Light Touch: WNL  B SLR, seated: neg B SLR, supine:  neg B FABER: neg B Reverse FABER: neg B Greater Troch: NT B Log Roll: neg B Sciatic Notch: NT   Laboratory and Imaging Data:  Assessment and Plan:     ICD-10-CM   1. Lateral epicondylitis of right elbow  M77.11 triamcinolone acetonide (KENALOG-40) injection 40 mg    2. Chronic left shoulder pain  M25.512 triamcinolone acetonide (KENALOG-40) injection 20 mg   G89.29     3. Impingement syndrome of left shoulder  M75.42 triamcinolone acetonide (KENALOG-40) injection 20 mg    4. Acute left-sided low back pain without sciatica  M54.50      Obvious right-sided lateral epicondylitis.  We reviewed tendinopathy in general as well as the anatomy associated with lateral epicondylitis.  He also has some acute on chronic left-sided shoulder pain, consistent with rotator cuff tendinopathy and impingement.  This is flared up right now.  Of note, contrary to above the patient did get 40 mg of Kenalog in the shoulder and 20 mg at the lateral epicondyle.  Focal musculoskeletal back pain without concern for disc pathology or radicular pain.  Intraarticular Shoulder Aspiration/Injection Procedure Note William Tucker [birth year] Date of procedure: 04/04/2023  Procedure: Large Joint Aspiration / Injection of Shoulder, Intraarticular, L Indications: Pain  Procedure Details Verbal consent was obtained from the patient. Risks explained and contrasted with benefits and alternatives. Patient prepped with Chloraprep and Ethyl Chloride used for anesthesia. An intraarticular shoulder injection was performed using the posterior approach; needle placed into joint capsule without difficulty. The patient tolerated the procedure well and had decreased pain post injection. No complications. Injection: 9 cc of Lidocaine 1% and 1 mL Kenalog 40 mg. Needle: 21 gauge, 2 inch   Tendon Origin / Insertion Injection Procedure Note William Tucker 07/09/1966 Date of procedure: 04/04/2023  Procedure: Tendon Origin  / Insertion Injection for Lateral Epicondylitis, R Indications: Pain  Procedure Details Verbal consent was obtained from the patient. Risks, benefits, and alternatives were discussed. Potential complications including loss of pigment, atrophy were discussed. Prepped with Chloraprep and Ethyl Chloride used for anesthesia. Under sterile conditions, the patient was injected at the point of maximal tenderness at the ECRB tendon with 1/2 cc of Lidocaine 1% and 1/2 cc of Kenalog 40 mg. Decreased pain after injection. No complications.  Needle size: 22 gauge 1 1/2 inch Medication: 1/2 cc of Kenalog 40 mg (equaling Kenalog 20 mg)   Medication Management during today's office visit: Meds ordered this encounter  Medications   triamcinolone acetonide (KENALOG-40) injection 20 mg   triamcinolone acetonide (KENALOG-40) injection 40 mg   Medications Discontinued During This Encounter  Medication Reason   hydrOXYzine (ATARAX/VISTARIL) 25 MG tablet No longer needed (for PRN medications)    Orders placed today for conditions managed today: No orders of the defined types were placed in this encounter.   Disposition: As needed if symptoms continue  Dragon [employer] One speech-to-text software was used for transcription in this dictation.  Possible transcriptional errors can occur using Animal nutritionist.   Signed,  Elpidio Galea. Rayya Yagi, MD  Outpatient Encounter Medications as of 04/04/2023  Medication Sig   amLODipine (NORVASC) 10 MG tablet Take 1 tablet (10 mg total) by mouth daily.   Cholecalciferol (VITAMIN D3 PO) Take by mouth daily.   cyanocobalamin 1000 MCG tablet Take 1,000 mcg by mouth daily.   loratadine (CLARITIN) 10 MG tablet Take 10 mg by mouth daily.   metoprolol succinate (TOPROL-XL) 25 MG 24 hr tablet Take 0.5 tablets (12.5 mg total) by mouth daily.   Multiple Vitamins-Minerals (ZINC PO) Take by mouth daily.   Tadalafil 2.5 MG TABS TAKE 1 TABLET BY MOUTH DAILY   tamsulosin (FLOMAX) 0.4  MG CAPS capsule Take 1 capsule (0.4 mg total) by mouth daily.   [DISCONTINUED] finasteride (PROSCAR) 5 MG tablet Take 1 tablet (5 mg total) by mouth daily.   [DISCONTINUED] hydrOXYzine (ATARAX/VISTARIL) 25 MG tablet Take 1 tablet (25 mg total) by mouth 3 (three) times daily as needed. (Patient not taking: Reported on 03/26/2023)   [EXPIRED] triamcinolone acetonide (KENALOG-40) injection 20 mg    [EXPIRED] triamcinolone acetonide (KENALOG-40) injection 40 mg    No facility-administered encounter medications on file as of 04/04/2023.

## 2023-04-04 ENCOUNTER — Ambulatory Visit: Payer: 59 | Admitting: Family Medicine

## 2023-04-04 ENCOUNTER — Encounter: Payer: Self-pay | Admitting: Family Medicine

## 2023-04-04 VITALS — BP 108/68 | HR 69 | Temp 98.1°F | Ht 68.5 in | Wt 175.4 lb

## 2023-04-04 DIAGNOSIS — G8929 Other chronic pain: Secondary | ICD-10-CM | POA: Diagnosis not present

## 2023-04-04 DIAGNOSIS — M7711 Lateral epicondylitis, right elbow: Secondary | ICD-10-CM

## 2023-04-04 DIAGNOSIS — M25512 Pain in left shoulder: Secondary | ICD-10-CM

## 2023-04-04 DIAGNOSIS — M7542 Impingement syndrome of left shoulder: Secondary | ICD-10-CM | POA: Diagnosis not present

## 2023-04-04 DIAGNOSIS — M545 Low back pain, unspecified: Secondary | ICD-10-CM | POA: Diagnosis not present

## 2023-04-04 MED ORDER — TRIAMCINOLONE ACETONIDE 40 MG/ML IJ SUSP
40.0000 mg | Freq: Once | INTRAMUSCULAR | Status: AC
Start: 2023-04-04 — End: 2023-04-04
  Administered 2023-04-04: 40 mg via INTRA_ARTICULAR

## 2023-04-04 MED ORDER — TRIAMCINOLONE ACETONIDE 40 MG/ML IJ SUSP
20.0000 mg | Freq: Once | INTRAMUSCULAR | Status: AC
Start: 2023-04-04 — End: 2023-04-04
  Administered 2023-04-04: 20 mg via INTRA_ARTICULAR

## 2023-04-05 ENCOUNTER — Other Ambulatory Visit: Payer: Self-pay | Admitting: Family Medicine

## 2023-04-05 DIAGNOSIS — N401 Enlarged prostate with lower urinary tract symptoms: Secondary | ICD-10-CM

## 2023-04-30 ENCOUNTER — Encounter: Payer: Self-pay | Admitting: Internal Medicine

## 2023-05-24 HISTORY — PX: COLONOSCOPY: SHX174

## 2023-06-04 ENCOUNTER — Ambulatory Visit (AMBULATORY_SURGERY_CENTER): Payer: 59 | Admitting: *Deleted

## 2023-06-04 ENCOUNTER — Encounter: Payer: Self-pay | Admitting: Internal Medicine

## 2023-06-04 ENCOUNTER — Telehealth: Payer: Self-pay | Admitting: *Deleted

## 2023-06-04 VITALS — Ht 68.5 in | Wt 165.0 lb

## 2023-06-04 DIAGNOSIS — Z86010 Personal history of colonic polyps: Secondary | ICD-10-CM

## 2023-06-04 DIAGNOSIS — Z800 Family history of malignant neoplasm of digestive organs: Secondary | ICD-10-CM

## 2023-06-04 NOTE — Telephone Encounter (Signed)
Attempt to reach pt will try other #.

## 2023-06-04 NOTE — Progress Notes (Signed)
Pt's name and DOB verified at the beginning of the pre-visit.  Pt denies any difficulty with ambulating,sitting, laying down or rolling side to side Gave both [facility] main # and MD on call # prior to instructions.  No egg or soy allergy known to patient  No issues known to pt with past sedation with any surgeries or procedures Pt denies having issues being intubated Pt has no issues moving head neck or swallowing No FH of Malignant Hyperthermia Pt is not on diet pills Pt is not on home 02  Pt is not on blood thinners  Pt denies issues with constipation  Pt is not on dialysis Pt denise any abnormal heart rhythms  Pt denies any upcoming cardiac testing Pt encouraged to use to use Singlecare or Goodrx to reduce cost  Patient's chart reviewed by Cathlyn Parsons CNRA prior to pre-visit and patient appropriate for the [facility].  Pre-visit completed and red dot placed by patient's name on their procedure day (on provider's schedule).  . Visit by phone Pt states weight is 165 lb Instructed pt why it is important to and  to call if they have any changes in health or new medications. Directed them to the # given and on instructions.   Pt states they will.  Instructions reviewed with pt and pt states understanding. Instructed to review again prior to procedure. Pt states they will.  Instructions sent by mail with coupon and by my chart

## 2023-06-11 ENCOUNTER — Other Ambulatory Visit: Payer: Self-pay | Admitting: Family Medicine

## 2023-06-15 ENCOUNTER — Ambulatory Visit (AMBULATORY_SURGERY_CENTER): Payer: 59 | Admitting: Internal Medicine

## 2023-06-15 ENCOUNTER — Encounter: Payer: Self-pay | Admitting: Internal Medicine

## 2023-06-15 VITALS — BP 104/77 | HR 66 | Temp 98.0°F | Resp 13 | Ht 68.5 in | Wt 165.0 lb

## 2023-06-15 DIAGNOSIS — Z800 Family history of malignant neoplasm of digestive organs: Secondary | ICD-10-CM

## 2023-06-15 DIAGNOSIS — Z09 Encounter for follow-up examination after completed treatment for conditions other than malignant neoplasm: Secondary | ICD-10-CM | POA: Diagnosis present

## 2023-06-15 DIAGNOSIS — Z86010 Personal history of colonic polyps: Secondary | ICD-10-CM

## 2023-06-15 DIAGNOSIS — D123 Benign neoplasm of transverse colon: Secondary | ICD-10-CM | POA: Diagnosis not present

## 2023-06-15 MED ORDER — SODIUM CHLORIDE 0.9 % IV SOLN: 500.0000 mL | Freq: Once | INTRAVENOUS | Status: DC

## 2023-06-15 NOTE — Progress Notes (Signed)
[facility] History and Physical   Primary Care Physician:  Eustaquio Boyden, MD   Reason for Procedure:    Encounter Diagnoses  Name Primary?   Personal history of colonic polyps Yes   Family history of malignant neoplasm of gastrointestinal tract      Plan:    colonoscopy     HPI: William Tucker is a 57 y.o. male for surveillance exam  02/2018 2 right hyperplastic polyps + has FHx CRCA and polyps so recall 2024 Past Medical History:  Diagnosis Date   Allergy    BPH (benign prostatic hypertrophy)    on proscar and flomax (saw Dr. Vonita Moss)   Hx of colonic polyps 03/29/2018   Hyperlipidemia    Hypertension    OSA on CPAP    auto-CPAP - [place]   Perennial allergic rhinitis    eval by Dr. Jenne Pane with ENT 2011   Sleep apnea    Subclinical hyperthyroidism    Graves (upper normal thyroid uptake, mildly elevated TSH R Ab)    Past Surgical History:  Procedure Laterality Date   BLADDER SURGERY     COLONOSCOPY  2009   WNL, rec rpt 10 yrs   COLONOSCOPY  02/2018   3 benign polyps, rpt 5 yrs Leone Payor)   CYSTOSCOPY  03/28/2000   biopsy negative for ICS   TONSILLECTOMY  as a child    Prior to Admission medications   Medication Sig Start Date End Date Taking? Authorizing Provider  amLODipine (NORVASC) 10 MG tablet Take 1 tablet (10 mg total) by mouth daily. 03/26/23  Yes Eustaquio Boyden, MD  finasteride (PROSCAR) 5 MG tablet TAKE 1 TABLET BY MOUTH DAILY 04/05/23  Yes Eustaquio Boyden, MD  loratadine (CLARITIN) 10 MG tablet Take 10 mg by mouth daily.   Yes [provider]  tamsulosin (FLOMAX) 0.4 MG CAPS capsule TAKE 1 CAPSULE BY MOUTH DAILY 06/11/23  Yes Eustaquio Boyden, MD  Cholecalciferol (VITAMIN D3 PO) Take by mouth daily. Patient not taking: Reported on 06/04/2023    [provider]  cyanocobalamin 1000 MCG tablet Take 1,000 mcg by mouth daily.    [provider]  metoprolol succinate (TOPROL-XL) 25 MG 24 hr tablet Take 0.5  tablets (12.5 mg total) by mouth daily. Patient not taking: Reported on 06/04/2023 03/26/23   Eustaquio Boyden, MD  Multiple Vitamins-Minerals (ZINC PO) Take by mouth daily. Patient not taking: Reported on 06/04/2023    [provider]  Tadalafil 2.5 MG TABS TAKE 1 TABLET BY MOUTH DAILY 06/06/22   Eustaquio Boyden, MD    Current Outpatient Medications  Medication Sig Dispense Refill   amLODipine (NORVASC) 10 MG tablet Take 1 tablet (10 mg total) by mouth daily. 90 tablet 4   finasteride (PROSCAR) 5 MG tablet TAKE 1 TABLET BY MOUTH DAILY 90 tablet 4   loratadine (CLARITIN) 10 MG tablet Take 10 mg by mouth daily.     tamsulosin (FLOMAX) 0.4 MG CAPS capsule TAKE 1 CAPSULE BY MOUTH DAILY 90 capsule 3   Cholecalciferol (VITAMIN D3 PO) Take by mouth daily. (Patient not taking: Reported on 06/04/2023)     cyanocobalamin 1000 MCG tablet Take 1,000 mcg by mouth daily.     metoprolol succinate (TOPROL-XL) 25 MG 24 hr tablet Take 0.5 tablets (12.5 mg total) by mouth daily. (Patient not taking: Reported on 06/04/2023) 45 tablet 4   Multiple Vitamins-Minerals (ZINC PO) Take by mouth daily. (Patient not taking: Reported on 06/04/2023)     Tadalafil 2.5 MG TABS TAKE 1  TABLET BY MOUTH DAILY 90 tablet 3   Current Facility-Administered Medications  Medication Dose Route Frequency Provider Last Rate Last Admin   0.9 %  sodium chloride infusion  500 mL Intravenous Once Iva Boop, MD        Allergies as of 06/15/2023 - Review Complete 06/15/2023  Allergen Reaction Noted   Lisinopril Itching and Other (See Comments) 06/06/2021   Losartan Itching 06/13/2021    Family History  Problem Relation Age of Onset   Hyperlipidemia Mother    Colon polyps Mother    Thyroid disease Mother        s/p iodine treatment   Hyperlipidemia Father    Hypertension Father    Stomach cancer Father 41   Stroke Maternal Uncle    Stroke Maternal Grandmother    Liver cancer Paternal Grandmother    Colon cancer  Paternal Grandmother    Cancer Paternal Grandmother 5       colon   CAD Paternal Grandfather        small MI   Diabetes Neg Hx    Heart disease Neg Hx    Rectal cancer Neg Hx     Social History   Socioeconomic History   Marital status: Married    Spouse name: Not on file   Number of children: 2   Years of education: Not on file   Highest education level: Not on file  Occupational History   Occupation: Diplomatic Services operational officer: [employer]  Tobacco Use   Smoking status: Never   Smokeless tobacco: Former    Types: Snuff, Chew   Tobacco comments:    Occasional dip snuff 20 years ago  Vaping Use   Vaping status: Never Used  Substance and Sexual Activity   Alcohol use: Yes    Alcohol/week: 4.0 standard drinks of alcohol    Types: 4 Standard drinks or equivalent per week    Comment: couple of times a week   Drug use: No   Sexual activity: Yes  Other Topics Concern   Not on file  Social History Narrative   Caffeine: 3-4 cups coffee/day   Married and lives with wife and 2 sons   Occupation: Patent examiner   Activity: walks several miles daily   Diet: good water, fruits/vegetables daily   Social Determinants of Health   Financial Resource Strain: Not on file  Food Insecurity: Not on file  Transportation Needs: Not on file  Physical Activity: Not on file  Stress: Not on file  Social Connections: Not on file  Intimate Partner Violence: Not on file    Review of Systems:  All other review of systems negative except as mentioned in the HPI.  Physical Exam: Vital signs BP (!) 126/93   Pulse 63   Temp 98 F (36.7 C) (Temporal)   Resp 14   Ht 5' 8.5" (1.74 m)   Wt 165 lb (74.8 kg)   SpO2 100%   BMI 24.72 kg/m   General:   Alert,  Well-developed, well-nourished, pleasant and cooperative in NAD Lungs:  Clear throughout to auscultation.   Heart:  Regular rate and rhythm; no murmurs, clicks, rubs,  or gallops. Abdomen:  Soft, nontender and nondistended.  Normal bowel sounds.   Neuro/Psych:  Alert and cooperative. Normal mood and affect. A and O x 3   @Staci Carver  Sena Slate, MD, [facility] [phone] (pager) 06/15/2023 8:43 AM@

## 2023-06-15 NOTE — Progress Notes (Signed)
Called to room to assist during endoscopic procedure.  Patient ID and intended procedure confirmed with present staff. Received instructions for my participation in the procedure from the performing physician.  

## 2023-06-15 NOTE — Progress Notes (Signed)
VS completed by CW.   Pt's states no medical or surgical changes since previsit or office visit.  

## 2023-06-15 NOTE — Patient Instructions (Addendum)
I found and removed one tiny polyp and saw diverticulosis.  Your next routine colonoscopy should be in 5 years - 2029.  I appreciate the opportunity to care for you. Iva Boop, MD, FACG YOU HAD AN ENDOSCOPIC PROCEDURE TODAY AT THE [facility]:   Refer to the procedure report that was given to you for any specific questions about what was found during the examination.  If the procedure report does not answer your questions, please call your gastroenterologist to clarify.  If you requested that your care partner not be given the details of your procedure findings, then the procedure report has been included in a sealed envelope for you to review at your convenience later.  YOU SHOULD EXPECT: Some feelings of bloating in the abdomen. Passage of more gas than usual.  Walking can help get rid of the air that was put into your GI tract during the procedure and reduce the bloating. If you had a lower endoscopy (such as a colonoscopy or flexible sigmoidoscopy) you may notice spotting of blood in your stool or on the toilet paper. If you underwent a bowel prep for your procedure, you may not have a normal bowel movement for a few days.  Please Note:  You might notice some irritation and congestion in your nose or some drainage.  This is from the oxygen used during your procedure.  There is no need for concern and it should clear up in a day or so.  SYMPTOMS TO REPORT IMMEDIATELY:  Following lower endoscopy (colonoscopy or flexible sigmoidoscopy):  Excessive amounts of blood in the stool  Significant tenderness or worsening of abdominal pains  Swelling of the abdomen that is new, acute  Fever of 100F or higher  For urgent or emergent issues, a gastroenterologist can be reached at any hour by calling [phone]. Do not use MyChart messaging for urgent concerns.    DIET:  We do recommend a small meal at first, but then you may proceed to your regular diet.  Drink plenty of fluids  but you should avoid alcoholic beverages for 24 hours.  ACTIVITY:  You should plan to take it easy for the rest of today and you should NOT DRIVE or use heavy machinery until tomorrow (because of the sedation medicines used during the test).    FOLLOW UP: Our staff will call the number listed on your records the next business day following your procedure.  We will call around 7:15- 8:00 am to check on you and address any questions or concerns that you may have regarding the information given to you following your procedure. If we do not reach you, we will leave a message.     If any biopsies were taken you will be contacted by phone or by letter within the next 1-3 weeks.  Please call us at [phone] if you have not heard about the biopsies in 3 weeks.    SIGNATURES/CONFIDENTIALITY: You and/or your care partner have signed paperwork which will be entered into your electronic medical record.  These signatures attest to the fact that that the information above on your After Visit Summary has been reviewed and is understood.  Full responsibility of the confidentiality of this discharge information lies with you and/or your care-partner.

## 2023-06-15 NOTE — Progress Notes (Signed)
Report to PACU, RN, vss, BBS= Clear.  

## 2023-06-15 NOTE — Op Note (Signed)
[facility] Patient Name: William Tucker Procedure Date: 06/15/2023 8:35 AM MRN: [id number] Endoscopist: Iva Boop , MD, [id number] Age: 57 Referring MD:  Date of Birth: [birth year] Gender: Male Account #: [id number] Procedure:                Colonoscopy Indications:              High risk colon cancer surveillance: Personal                            history of colonic polyps + family hx polyps                            first-degree relative < 60 and distant relatives w/                            CRCA Medicines:                Monitored Anesthesia Care Procedure:                Pre-Anesthesia Assessment:                           - Prior to the procedure, a History and Physical                            was performed, and patient medications and                            allergies were reviewed. The patient's tolerance of                            previous anesthesia was also reviewed. The risks                            and benefits of the procedure and the sedation                            options and risks were discussed with the patient.                            All questions were answered, and informed consent                            was obtained. Prior Anticoagulants: The patient has                            taken no anticoagulant or antiplatelet agents. ASA                            Grade Assessment: II - A patient with mild systemic                            disease. After reviewing the risks and benefits,  the patient was deemed in satisfactory condition to                            undergo the procedure.                           After obtaining informed consent, the colonoscope                            was passed under direct vision. Throughout the                            procedure, the patient's blood pressure, pulse, and                            oxygen saturations were monitored continuously. The                             Olympus CF-HQ190L ([id number]) Colonoscope was                            introduced through the anus and advanced to the the                            cecum, identified by appendiceal orifice and                            ileocecal valve. The colonoscopy was performed                            without difficulty. The patient tolerated the                            procedure well. The quality of the bowel                            preparation was good. The ileocecal valve,                            appendiceal orifice, and rectum were photographed. Scope In: 8:52:05 AM Scope Out: 9:08:26 AM Scope Withdrawal Time: 0 hours 12 minutes 25 seconds  Total Procedure Duration: 0 hours 16 minutes 21 seconds  Findings:                 The perianal and digital rectal examinations were                            normal. Pertinent negatives include normal prostate                            (size, shape, and consistency).                           A diminutive polyp was found in the transverse  colon. The polyp was sessile. The polyp was removed                            with a cold snare. Resection and retrieval were                            complete. Verification of patient identification                            for the specimen was done. Estimated blood loss was                            minimal.                           Multiple diverticula were found in the sigmoid                            colon.                           The exam was otherwise without abnormality on                            direct and retroflexion views. Complications:            No immediate complications. Estimated Blood Loss:     Estimated blood loss was minimal. Impression:               - One diminutive polyp in the transverse colon,                            removed with a cold snare. Resected and retrieved.                           - Diverticulosis in the  sigmoid colon.                           - The examination was otherwise normal on direct                            and retroflexion views.                           - Personal history of colonic polyps 2 right                            hyperplastic 2019 Recommendation:           - Patient has a contact number available for                            emergencies. The signs and symptoms of potential                            delayed complications were discussed with the  patient. Return to normal activities tomorrow.                            Written discharge instructions were provided to the                            patient.                           - Resume previous diet.                           - Continue present medications.                           - Await pathology results.                           - Repeat colonoscopy in 5 years. Iva Boop, MD 06/15/2023 9:19:56 AM This report has been signed electronically.

## 2023-06-18 ENCOUNTER — Telehealth: Payer: Self-pay | Admitting: *Deleted

## 2023-06-18 NOTE — Telephone Encounter (Signed)
  Follow up Call-     06/15/2023    8:07 AM  Call back number  Post procedure Call Back phone  # [phone]  Permission to leave phone message Yes     Patient questions:   Message left to call us if necessary.

## 2023-06-20 ENCOUNTER — Encounter: Payer: Self-pay | Admitting: Internal Medicine

## 2023-07-03 NOTE — Progress Notes (Signed)
Jaleel Allen T. Amiliah Campisi, MD, CAQ Sports Medicine [facility] [address] [place], [address]  Phone: [phone]  FAX: [phone]  William Tucker - 57 y.o. male  MRN [id number]  Date of Birth: [birth year]  Date: 07/04/2023  PCP: Eustaquio Boyden, MD  Referral: Eustaquio Boyden, MD  Chief Complaint  Patient presents with   Shoulder Pain    Left-follow up-Much better but still sore   Wrist Pain    Left   Arm Pain    Right   Subjective:   William Tucker is a 57 y.o. very pleasant male patient with Body mass index is 25.7 kg/m. who presents with the following:  Patient is here for follow-up from April 04, 2023.  At that time he was having some intermittent chronic left-sided shoulder pain and some impingement phenomenon.  I did do a shoulder injection at that point, and he returns today significantly improved compared to his initial evaluation.  At that time, he also had some right-sided lateral epicondylitis.  I did do a lateral epicondylar injection at the time.  L shoulder is still a little bit sore.  Does not have the power that he used to.   Has a little bit of pain.  It is not 100%, but it is definitely better compared to when I saw him the first time.  His range of motion is full and his strength is entirely normal.  He does have some pain in the posterior shoulder.  R elbow is also much better.  Doing much better.  He does have some minor discomfort occasionally, but it is by enlarge a lot better than it was the last time that I saw him.  L true wrist and anteriorly and is constantly sore.  No really debilitating.   Has not really tried all that much.  He has some mild tenderness on the dorsum of the left wrist.  No significant pain in the volar aspect, fingers, PIP, DIP, MCP, or CMC joints.    Review of Systems is noted in the HPI, as appropriate  Objective:   BP 108/70 (BP Location: Right Arm, Patient Position: Sitting,  Cuff Size: Normal)   Pulse 83   Temp 98.4 F (36.9 C) (Temporal)   Ht 5' 8.5" (1.74 m)   Wt 171 lb 8 oz (77.8 kg)   SpO2 96%   BMI 25.70 kg/m   GEN: No acute distress; alert,appropriate. PULM: Breathing comfortably in no respiratory distress PSYCH: Normally interactive.    Shoulder: L Inspection: No muscle wasting or winging Ecchymosis/edema: neg  AC joint, scapula, clavicle: NT Cervical spine: NT, full ROM Spurling's: neg Abduction: full, 5/5 Flexion: full, 5/5 IR, full, lift-off: 5/5 ER at neutral: full, 5/5 AC crossover and compression: Crossover is positive Neer: neg Hawkins: Mildly positive Drop Test: neg Jobe: neg Supraspinatus insertion: NT Bicipital groove: NT Speed's: neg Yergason's: neg Sulcus sign: neg Scapular dyskinesis: none C5-T1 intact Sensation intact Grip 5/5    R elbow Ecchymosis or edema: neg ROM: full flexion, extension, pronation, supination Shoulder ROM: Full Flexion: 5/5 Extension: 5/5 Supination: 5/5  Pronation: 5/5 Wrist ext: 5/5 Wrist flexion: 5/5 No gross bony abnormality Varus and Valgus stress: stable ECRB tenderness: neg Medial epicondyle: NT Lateral epicondyle, resisted wrist extension from wrist full pronation and flexion: NT  Tinel's, Elbow: negative   Laboratory and Imaging Data:  Assessment and Plan:     ICD-10-CM   1. Impingement syndrome of left shoulder  M75.42     2. Chronic left shoulder pain  M25.512    G89.29     3. Lateral epicondylitis of right elbow  M77.11      He does have some intermittent relatively mild left-sided shoulder pain that is persistent after his prior office visit, but much better than before.  I did give him a rotator cuff and scapular stabilization program to work on from the [employer] [employer] orthopedic surgery.  If he continues to have some issues, formal physical therapy would also be reasonable and appropriate.  He does have some minor tenderness at the lateral  epicondyle.  He also has some mild tenderness in the dorsum of the left wrist.  I am going to give the patient a burst of some steroids to take, and hopefully this will calm things down.  He also can try some topical Voltaren gel.  Medication Management during today's office visit: Meds ordered this encounter  Medications   predniSONE (DELTASONE) 20 MG tablet    Sig: 2 tabs po daily for 5 days, then 1 tab po daily for 5 days    Dispense:  15 tablet    Refill:  0   There are no discontinued medications.  Orders placed today for conditions managed today: No orders of the defined types were placed in this encounter.   Disposition: No follow-ups on file.  [employer] One speech-to-text software was used for transcription in this dictation.  Possible transcriptional errors can occur using Animal nutritionist.   Signed,  Elpidio Galea. Dalene Robards, MD   Outpatient Encounter Medications as of 07/04/2023  Medication Sig   predniSONE (DELTASONE) 20 MG tablet 2 tabs po daily for 5 days, then 1 tab po daily for 5 days   amLODipine (NORVASC) 10 MG tablet Take 1 tablet (10 mg total) by mouth daily.   Cholecalciferol (VITAMIN D3 PO) Take by mouth daily. (Patient not taking: Reported on 06/04/2023)   cyanocobalamin 1000 MCG tablet Take 1,000 mcg by mouth daily.   finasteride (PROSCAR) 5 MG tablet TAKE 1 TABLET BY MOUTH DAILY   loratadine (CLARITIN) 10 MG tablet Take 10 mg by mouth daily.   metoprolol succinate (TOPROL-XL) 25 MG 24 hr tablet Take 0.5 tablets (12.5 mg total) by mouth daily. (Patient not taking: Reported on 06/04/2023)   Multiple Vitamins-Minerals (ZINC PO) Take by mouth daily. (Patient not taking: Reported on 06/04/2023)   Tadalafil 2.5 MG TABS TAKE 1 TABLET BY MOUTH DAILY   tamsulosin (FLOMAX) 0.4 MG CAPS capsule TAKE 1 CAPSULE BY MOUTH DAILY   No facility-administered encounter medications on file as of 07/04/2023.

## 2023-07-04 ENCOUNTER — Encounter: Payer: Self-pay | Admitting: Family Medicine

## 2023-07-04 ENCOUNTER — Ambulatory Visit: Payer: 59 | Admitting: Family Medicine

## 2023-07-04 VITALS — BP 108/70 | HR 83 | Temp 98.4°F | Ht 68.5 in | Wt 171.5 lb

## 2023-07-04 DIAGNOSIS — G8929 Other chronic pain: Secondary | ICD-10-CM | POA: Diagnosis not present

## 2023-07-04 DIAGNOSIS — M7711 Lateral epicondylitis, right elbow: Secondary | ICD-10-CM | POA: Diagnosis not present

## 2023-07-04 DIAGNOSIS — M25512 Pain in left shoulder: Secondary | ICD-10-CM

## 2023-07-04 DIAGNOSIS — M7542 Impingement syndrome of left shoulder: Secondary | ICD-10-CM | POA: Diagnosis not present

## 2023-07-04 MED ORDER — PREDNISONE 20 MG PO TABS: ORAL_TABLET | ORAL | 0 refills | Status: DC

## 2023-09-03 ENCOUNTER — Other Ambulatory Visit: Payer: Self-pay | Admitting: Family Medicine

## 2024-03-20 ENCOUNTER — Other Ambulatory Visit: Payer: Self-pay | Admitting: Family Medicine

## 2024-03-20 ENCOUNTER — Encounter: Payer: Self-pay | Admitting: Family Medicine

## 2024-03-20 DIAGNOSIS — E785 Hyperlipidemia, unspecified: Secondary | ICD-10-CM

## 2024-03-20 DIAGNOSIS — E538 Deficiency of other specified B group vitamins: Secondary | ICD-10-CM

## 2024-03-20 DIAGNOSIS — E0500 Thyrotoxicosis with diffuse goiter without thyrotoxic crisis or storm: Secondary | ICD-10-CM

## 2024-03-20 DIAGNOSIS — N401 Enlarged prostate with lower urinary tract symptoms: Secondary | ICD-10-CM

## 2024-03-24 ENCOUNTER — Other Ambulatory Visit (INDEPENDENT_AMBULATORY_CARE_PROVIDER_SITE_OTHER): Payer: 59

## 2024-03-24 DIAGNOSIS — E538 Deficiency of other specified B group vitamins: Secondary | ICD-10-CM | POA: Diagnosis not present

## 2024-03-24 DIAGNOSIS — N401 Enlarged prostate with lower urinary tract symptoms: Secondary | ICD-10-CM | POA: Diagnosis not present

## 2024-03-24 DIAGNOSIS — N138 Other obstructive and reflux uropathy: Secondary | ICD-10-CM | POA: Diagnosis not present

## 2024-03-24 DIAGNOSIS — E0500 Thyrotoxicosis with diffuse goiter without thyrotoxic crisis or storm: Secondary | ICD-10-CM | POA: Diagnosis not present

## 2024-03-24 DIAGNOSIS — E785 Hyperlipidemia, unspecified: Secondary | ICD-10-CM

## 2024-03-24 LAB — COMPREHENSIVE METABOLIC PANEL WITH GFR
ALT: 16 U/L (ref 0–53)
AST: 15 U/L (ref 0–37)
Albumin: 4.6 g/dL (ref 3.5–5.2)
Alkaline Phosphatase: 58 U/L (ref 39–117)
BUN: 15 mg/dL (ref 6–23)
CO2: 27 meq/L (ref 19–32)
Calcium: 8.9 mg/dL (ref 8.4–10.5)
Chloride: 102 meq/L (ref 96–112)
Creatinine, Ser: 1.39 mg/dL (ref 0.40–1.50)
GFR: 56.78 mL/min — ABNORMAL LOW (ref >60.00)
Glucose, Bld: 118 mg/dL — ABNORMAL HIGH (ref 70–99)
Potassium: 3.5 meq/L (ref 3.5–5.1)
Sodium: 138 meq/L (ref 135–145)
Total Bilirubin: 0.6 mg/dL (ref 0.2–1.2)
Total Protein: 6.8 g/dL (ref 6.0–8.3)

## 2024-03-24 LAB — LIPID PANEL
Cholesterol: 186 mg/dL (ref 0–200)
HDL: 69.7 mg/dL (ref >39.00)
LDL Cholesterol: 105 mg/dL — ABNORMAL HIGH (ref 0–99)
NonHDL: 116.44
Total CHOL/HDL Ratio: 3
Triglycerides: 56 mg/dL (ref 0.0–149.0)
VLDL: 11.2 mg/dL (ref 0.0–40.0)

## 2024-03-24 LAB — VITAMIN B12: Vitamin B-12: 182 pg/mL — ABNORMAL LOW (ref 211–911)

## 2024-03-24 LAB — T4, FREE: Free T4: 0.8 ng/dL (ref 0.60–1.60)

## 2024-03-24 LAB — TSH: TSH: 0.48 u[IU]/mL (ref 0.35–5.50)

## 2024-03-24 LAB — PSA: PSA: 0.06 ng/mL — ABNORMAL LOW (ref 0.10–4.00)

## 2024-03-26 ENCOUNTER — Ambulatory Visit: Payer: Self-pay | Admitting: Family Medicine

## 2024-03-31 ENCOUNTER — Ambulatory Visit (INDEPENDENT_AMBULATORY_CARE_PROVIDER_SITE_OTHER): Payer: 59 | Admitting: Family Medicine

## 2024-03-31 ENCOUNTER — Encounter: Payer: Self-pay | Admitting: Family Medicine

## 2024-03-31 VITALS — BP 124/82 | HR 67 | Temp 98.0°F | Ht 67.25 in | Wt 173.4 lb

## 2024-03-31 DIAGNOSIS — Z0000 Encounter for general adult medical examination without abnormal findings: Secondary | ICD-10-CM | POA: Diagnosis not present

## 2024-03-31 DIAGNOSIS — E538 Deficiency of other specified B group vitamins: Secondary | ICD-10-CM

## 2024-03-31 DIAGNOSIS — N401 Enlarged prostate with lower urinary tract symptoms: Secondary | ICD-10-CM

## 2024-03-31 DIAGNOSIS — E785 Hyperlipidemia, unspecified: Secondary | ICD-10-CM

## 2024-03-31 DIAGNOSIS — J302 Other seasonal allergic rhinitis: Secondary | ICD-10-CM

## 2024-03-31 DIAGNOSIS — N138 Other obstructive and reflux uropathy: Secondary | ICD-10-CM

## 2024-03-31 DIAGNOSIS — E0500 Thyrotoxicosis with diffuse goiter without thyrotoxic crisis or storm: Secondary | ICD-10-CM

## 2024-03-31 DIAGNOSIS — N529 Male erectile dysfunction, unspecified: Secondary | ICD-10-CM

## 2024-03-31 DIAGNOSIS — I10 Essential (primary) hypertension: Secondary | ICD-10-CM | POA: Diagnosis not present

## 2024-03-31 DIAGNOSIS — G4733 Obstructive sleep apnea (adult) (pediatric): Secondary | ICD-10-CM

## 2024-03-31 MED ORDER — TAMSULOSIN HCL 0.4 MG PO CAPS: 0.4000 mg | ORAL_CAPSULE | Freq: Every day | ORAL | 4 refills | Status: AC

## 2024-03-31 MED ORDER — METOPROLOL SUCCINATE ER 25 MG PO TB24: 12.5000 mg | ORAL_TABLET | Freq: Every day | ORAL | 4 refills | Status: AC

## 2024-03-31 MED ORDER — TADALAFIL 2.5 MG PO TABS: 1.0000 | ORAL_TABLET | Freq: Every day | ORAL | 4 refills | Status: AC

## 2024-03-31 MED ORDER — AMLODIPINE BESYLATE 10 MG PO TABS: 10.0000 mg | ORAL_TABLET | Freq: Every day | ORAL | 4 refills | Status: AC

## 2024-03-31 MED ORDER — FINASTERIDE 5 MG PO TABS: 5.0000 mg | ORAL_TABLET | Freq: Every day | ORAL | 4 refills | Status: AC

## 2024-03-31 NOTE — Progress Notes (Signed)
 Ph: [phone] Fax: [phone]   Patient ID: William Tucker, male    DOB: [birth year], 58 y.o.   MRN: [id number]  This visit was conducted in person.  BP 124/82   Pulse 67   Temp 98 F (36.7 C) (Oral)   Ht 5' 7.25" (1.708 m)   Wt 173 lb 6 oz (78.6 kg)   SpO2 95%   BMI 26.95 kg/m    CC: CPE Subjective:   HPI: William Tucker is a 58 y.o. male presenting on 03/31/2024 for Annual Exam   Retired from [employer] examiner 2021, now works at his [employer].   Graves disease - sees endo Aldona Amel), off medications. Last seen 2021.   HTN - continues amlodipine  10mg  daily, toprol  XL 12.5mg  daily. Tolerating meds well. Previous poor response to losartan  and lisinopril .    H/o OSA on CPAP through pulm ([facility]), last seen 07/2019. Recommendation was autoCPAP when last seen.  On CPAP 5 cm H2O.   BPH - stable period on finasteride  and doxazosin .   Preventative: COLONOSCOPY 05/2023 - TA, diverticulosis, rpt 5 yrs Willy Harvest) Prostate - yearly PSA on alpha blocker and 5a reductase inhibitor for BPH Lung cancer screening - not eligible  Flu shot - declines  COVID vaccine - declined  Tetanus - 1998, 2011. Tdap 02/2022  Shingrix - 01/2020, 03/2020  Seat belt use discussed  Sunscreen use discussed. No changing moles on skin. Sees dermatologist yearly. 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 Interim medical history since our last visit reviewed. Allergies and medications reviewed and updated. Outpatient Medications Prior to Visit  Medication Sig Dispense Refill   Cholecalciferol (VITAMIN D3 PO) Take by mouth daily.      cyanocobalamin  1000 MCG tablet Take 1,000 mcg by mouth daily.     loratadine (CLARITIN) 10 MG tablet Take 10 mg by mouth daily.     Multiple Vitamins-Minerals (ZINC PO) Take by mouth daily.     amLODipine  (NORVASC ) 10 MG tablet Take 1 tablet (10 mg total) by mouth daily. 90 tablet 4   finasteride  (PROSCAR ) 5 MG tablet TAKE 1 TABLET BY MOUTH DAILY 90 tablet 4   metoprolol  succinate (TOPROL -XL) 25 MG 24 hr tablet Take 0.5 tablets (12.5 mg total) by mouth daily. 45 tablet 4   Tadalafil  2.5 MG TABS TAKE 1 TABLET BY MOUTH DAILY 90 tablet 3   tamsulosin  (FLOMAX ) 0.4 MG CAPS capsule TAKE 1 CAPSULE BY MOUTH DAILY 90 capsule 3   predniSONE  (DELTASONE ) 20 MG tablet 2 tabs po daily for 5 days, then 1 tab po daily for 5 days 15 tablet 0   No facility-administered medications prior to visit.     Per HPI unless specifically indicated in ROS section below Review of Systems  Constitutional:  Negative for activity change, appetite change, chills, fatigue, fever and unexpected weight change.  HENT:  Negative for hearing loss.   Eyes:  Negative for visual disturbance.  Respiratory:  Negative for cough, chest tightness, shortness of breath and wheezing.   Cardiovascular:  Negative for chest pain, palpitations and leg swelling.  Gastrointestinal:  Negative for abdominal distention,  abdominal pain, blood in stool, constipation, diarrhea, nausea and vomiting.  Genitourinary:  Negative for difficulty urinating and hematuria.  Musculoskeletal:  Negative for arthralgias, myalgias and neck pain.  Skin:  Negative for rash.  Neurological:  Negative for dizziness, seizures, syncope and headaches.  Hematological:  Negative for adenopathy. Does not bruise/bleed easily.  Psychiatric/Behavioral:  Negative for dysphoric mood. The patient is not nervous/anxious.     Objective:  BP 124/82   Pulse 67   Temp 98 F (36.7 C) (Oral)   Ht 5' 7.25" (1.708 m)   Wt 173 lb 6 oz (78.6 kg)   SpO2 95%   BMI 26.95 kg/m   Wt  Readings from Last 3 Encounters:  03/31/24 173 lb 6 oz (78.6 kg)  07/04/23 171 lb 8 oz (77.8 kg)  06/15/23 165 lb (74.8 kg)      Physical Exam Vitals and nursing note reviewed.  Constitutional:      General: He is not in acute distress.    Appearance: Normal appearance. He is well-developed. He is not ill-appearing.  HENT:     Head: Normocephalic and atraumatic.     Right Ear: Hearing, tympanic membrane, ear canal and external ear normal.     Left Ear: Hearing, tympanic membrane, ear canal and external ear normal.     Mouth/Throat:     Mouth: Mucous membranes are moist.     Pharynx: Oropharynx is clear. No oropharyngeal exudate or posterior oropharyngeal erythema.  Eyes:     General: No scleral icterus.    Extraocular Movements: Extraocular movements intact.     Conjunctiva/sclera: Conjunctivae normal.     Pupils: Pupils are equal, round, and reactive to light.  Neck:     Thyroid : No thyroid  mass or thyromegaly.  Cardiovascular:     Rate and Rhythm: Normal rate and regular rhythm.     Pulses: Normal pulses.          Radial pulses are 2+ on the right side and 2+ on the left side.     Heart sounds: Normal heart sounds. No murmur heard. Pulmonary:     Effort: Pulmonary effort is normal. No respiratory distress.     Breath sounds: Normal breath sounds. No wheezing, rhonchi or rales.  Abdominal:     General: Bowel sounds are normal. There is no distension.     Palpations: Abdomen is soft. There is no mass.     Tenderness: There is no abdominal tenderness. There is no guarding or rebound.     Hernia: No hernia is present.  Musculoskeletal:        General: Normal range of motion.     Cervical back: Normal range of motion and neck supple.     Right lower leg: No edema.     Left lower leg: No edema.  Lymphadenopathy:     Cervical: No cervical adenopathy.  Skin:    General: Skin is warm and dry.     Findings: No rash.  Neurological:     General: No focal deficit present.      Mental Status: He is alert and oriented to person, place, and time.  Psychiatric:        Mood and Affect: Mood normal.        Behavior: Behavior normal.        Thought Content: Thought content normal.        Judgment: Judgment normal.       Results for orders placed or performed in visit on 03/24/24  T4, free   Collection Time: 03/24/24  8:01 AM  Result Value Ref Range   Free T4 0.80 0.60 - 1.60 ng/dL  TSH   Collection Time: 03/24/24  8:01 AM  Result Value Ref Range   TSH 0.48 0.35 - 5.50 uIU/mL  Vitamin B12   Collection Time: 03/24/24  8:01 AM  Result Value Ref Range   Vitamin B-12 182 (L) 211 - 911 pg/mL  PSA   Collection Time: 03/24/24  8:01 AM  Result Value Ref Range   PSA 0.06 (L) 0.10 - 4.00 ng/mL  Comprehensive metabolic panel with GFR   Collection Time: 03/24/24  8:01 AM  Result Value Ref Range   Sodium 138 135 - 145 mEq/L   Potassium 3.5 3.5 - 5.1 mEq/L   Chloride 102 96 - 112 mEq/L   CO2 27 19 - 32 mEq/L   Glucose, Bld 118 (H) 70 - 99 mg/dL   BUN 15 6 - 23 mg/dL   Creatinine, Ser 2.13 0.40 - 1.50 mg/dL   Total Bilirubin 0.6 0.2 - 1.2 mg/dL   Alkaline Phosphatase 58 39 - 117 U/L   AST 15 0 - 37 U/L   ALT 16 0 - 53 U/L   Total Protein 6.8 6.0 - 8.3 g/dL   Albumin 4.6 3.5 - 5.2 g/dL   GFR 08.65 (L) >78.46 mL/min   Calcium 8.9 8.4 - 10.5 mg/dL  Lipid panel   Collection Time: 03/24/24  8:01 AM  Result Value Ref Range   Cholesterol 186 0 - 200 mg/dL   Triglycerides 96.2 0.0 - 149.0 mg/dL   HDL 95.28 >41.32 mg/dL   VLDL 44.0 0.0 - 10.2 mg/dL   LDL Cholesterol 725 (H) 0 - 99 mg/dL   Total CHOL/HDL Ratio 3    NonHDL 116.44     Assessment & Plan:   Problem List Items Addressed This Visit     HLD (hyperlipidemia)   Chronic, stable off medication. Reviewed diet choices to improve cholesterol levels.  No fmhx CAD/stroke.  Discussed CAC scoring - to consider.  The 10-year ASCVD risk score (Arnett DK, et al., 2019) is: 4.7%   Values used to calculate the  score:     Age: 7 years     Sex: Male     Is Non-Hispanic African American: No     Diabetic: No     Tobacco smoker: No     Systolic Blood Pressure: 124 mmHg     Is BP treated: Yes     HDL Cholesterol: 69.7 mg/dL     Total Cholesterol: 186 mg/dL       Relevant Medications   amLODipine  (NORVASC ) 10 MG tablet   metoprolol  succinate (TOPROL -XL) 25 MG 24 hr tablet   Tadalafil  2.5 MG TABS   Allergic rhinitis   Continue daily claritin with PRN flonase      Benign prostatic hyperplasia with urinary obstruction   Chronic, stable. Continue finasteride , flomax , low dose cialis  2.5mg  daily.       Relevant Medications   finasteride  (PROSCAR ) 5 MG tablet   tamsulosin  (FLOMAX ) 0.4 MG CAPS capsule   Graves disease   TFTs stable. Last saw endo 2021.       Relevant Medications   metoprolol  succinate (TOPROL -XL) 25 MG 24 hr tablet   Healthcare maintenance - Primary (Chronic)   Preventative protocols reviewed and updated unless pt declined. Discussed healthy diet and lifestyle.       Vitamin B12 deficiency   Levels low - rec restart  B12 500-1000mcg daily OTC.      OSA on CPAP   Continue nightly CPAP.       Erectile dysfunction   Continue low dose cialis  2.5mg  daily.       Hypertension   Chronic, stable. Continue current regimen.       Relevant Medications   amLODipine  (NORVASC ) 10 MG tablet   metoprolol  succinate (TOPROL -XL) 25 MG 24 hr tablet   Tadalafil  2.5 MG TABS     Meds ordered this encounter  Medications   amLODipine  (NORVASC ) 10 MG tablet    Sig: Take 1 tablet (10 mg total) by mouth daily.    Dispense:  90 tablet    Refill:  4   finasteride  (PROSCAR ) 5 MG tablet    Sig: Take 1 tablet (5 mg total) by mouth daily.    Dispense:  90 tablet    Refill:  4   metoprolol  succinate (TOPROL -XL) 25 MG 24 hr tablet    Sig: Take 0.5 tablets (12.5 mg total) by mouth daily.    Dispense:  45 tablet    Refill:  4   tamsulosin  (FLOMAX ) 0.4 MG CAPS capsule    Sig: Take 1  capsule (0.4 mg total) by mouth daily.    Dispense:  90 capsule    Refill:  4   Tadalafil  2.5 MG TABS    Sig: Take 1 tablet (2.5 mg total) by mouth daily.    Dispense:  90 tablet    Refill:  4    No orders of the defined types were placed in this encounter.   Patient Instructions  You are doing well today  Continue current medications.  Return as needed or in 1 year for next physical.   Follow up plan: Return in about 1 year (around 03/31/2025) for annual exam, prior fasting for blood work.  Claire Crick, MD

## 2024-03-31 NOTE — Assessment & Plan Note (Signed)
 Chronic, stable. Continue finasteride , flomax , low dose cialis  2.5mg  daily.

## 2024-03-31 NOTE — Assessment & Plan Note (Addendum)
 Chronic, stable off medication. Reviewed diet choices to improve cholesterol levels.  No fmhx CAD/stroke.  Discussed CAC scoring - to consider.  The 10-year ASCVD risk score (Arnett DK, et al., 2019) is: 4.7%   Values used to calculate the score:     Age: 58 years     Sex: Male     Is Non-Hispanic African American: No     Diabetic: No     Tobacco smoker: No     Systolic Blood Pressure: 124 mmHg     Is BP treated: Yes     HDL Cholesterol: 69.7 mg/dL     Total Cholesterol: 186 mg/dL

## 2024-03-31 NOTE — Assessment & Plan Note (Signed)
 TFTs stable. Last saw endo 2021.

## 2024-03-31 NOTE — Assessment & Plan Note (Signed)
 -  Continue nightly CPAP

## 2024-03-31 NOTE — Assessment & Plan Note (Signed)
 Levels low - rec restart B12 500-1000mcg daily OTC.

## 2024-03-31 NOTE — Patient Instructions (Addendum)
 You are doing well today  Continue current medications.  Return as needed or in 1 year for next physical.

## 2024-03-31 NOTE — Assessment & Plan Note (Signed)
 Continue daily claritin with PRN flonase

## 2024-03-31 NOTE — Assessment & Plan Note (Signed)
 Chronic, stable. Continue current regimen.

## 2024-03-31 NOTE — Assessment & Plan Note (Signed)
 Preventative protocols reviewed and updated unless pt declined. Discussed healthy diet and lifestyle.

## 2024-03-31 NOTE — Assessment & Plan Note (Signed)
 Continue low dose cialis  2.5mg  daily.

## 2025-03-27 ENCOUNTER — Other Ambulatory Visit

## 2025-04-03 ENCOUNTER — Encounter: Admitting: Family Medicine
